# Patient Record
Sex: Male | Born: 2000 | Race: White | Hispanic: No | Smoking: Never smoker
Health system: Southern US, Community
[De-identification: ages and names within clinical notes are randomized; demographics above are authoritative.]

## PROBLEM LIST (undated history)

## (undated) HISTORY — PX: TONSILLECTOMY: SHX5217

## (undated) HISTORY — PX: ADENOIDECTOMY: SUR15

## (undated) HISTORY — PX: TONSILLECTOMY: SUR1361

## (undated) HISTORY — PX: TONSILLECTOMY AND ADENOIDECTOMY: SUR1326

---
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
## 2018-01-04 DIAGNOSIS — L700 Acne vulgaris: Secondary | ICD-10-CM | POA: Diagnosis not present

## 2018-01-04 DIAGNOSIS — Z79899 Other long term (current) drug therapy: Secondary | ICD-10-CM | POA: Diagnosis not present

## 2018-01-04 DIAGNOSIS — K130 Diseases of lips: Secondary | ICD-10-CM | POA: Diagnosis not present

## 2018-02-07 DIAGNOSIS — L700 Acne vulgaris: Secondary | ICD-10-CM | POA: Diagnosis not present

## 2018-02-07 DIAGNOSIS — Z79899 Other long term (current) drug therapy: Secondary | ICD-10-CM | POA: Diagnosis not present

## 2018-03-10 DIAGNOSIS — L700 Acne vulgaris: Secondary | ICD-10-CM | POA: Diagnosis not present

## 2018-03-10 DIAGNOSIS — Z79899 Other long term (current) drug therapy: Secondary | ICD-10-CM | POA: Diagnosis not present

## 2018-03-23 DIAGNOSIS — M5382 Other specified dorsopathies, cervical region: Secondary | ICD-10-CM | POA: Diagnosis not present

## 2018-03-23 DIAGNOSIS — M9901 Segmental and somatic dysfunction of cervical region: Secondary | ICD-10-CM | POA: Diagnosis not present

## 2018-03-23 DIAGNOSIS — M9902 Segmental and somatic dysfunction of thoracic region: Secondary | ICD-10-CM | POA: Diagnosis not present

## 2018-04-04 ENCOUNTER — Encounter: Payer: Self-pay | Admitting: Podiatry

## 2018-04-04 ENCOUNTER — Ambulatory Visit (INDEPENDENT_AMBULATORY_CARE_PROVIDER_SITE_OTHER): Payer: 59 | Admitting: Podiatry

## 2018-04-04 VITALS — BP 119/64 | HR 57

## 2018-04-04 DIAGNOSIS — L600 Ingrowing nail: Secondary | ICD-10-CM | POA: Diagnosis not present

## 2018-04-04 MED ORDER — DOXYCYCLINE HYCLATE 100 MG PO TABS: 100.0000 mg | ORAL_TABLET | Freq: Two times a day (BID) | ORAL | 0 refills | Status: DC

## 2018-04-04 MED ORDER — GENTAMICIN SULFATE 0.1 % EX CREA: 1.0000 "application " | TOPICAL_CREAM | Freq: Two times a day (BID) | CUTANEOUS | 1 refills | Status: DC

## 2018-04-04 NOTE — Patient Instructions (Signed)

## 2018-04-06 NOTE — Progress Notes (Signed)
   Subjective: Patient presents today for evaluation of pain to the lateral border of the right hallux that began 1-2 weeks ago. He reports associated bloody and purulent drainage. Patient is concerned for possible ingrown nail as he has gotten them in the past, just not as severe. Applying pressure to the toe increases the pain. He has been soaking the toe in peroxide for treatment. Patient presents today for further treatment and evaluation.  History reviewed. No pertinent past medical history.  Objective:  General: Well developed, nourished, in no acute distress, alert and oriented x3   Dermatology: Skin is warm, dry and supple bilateral. Lateral border of the right hallux appears to be erythematous with evidence of an ingrowing nail. Pain on palpation noted to the border of the nail fold. The remaining nails appear unremarkable at this time. There are no open sores, lesions.  Vascular: Dorsalis Pedis artery and Posterior Tibial artery pedal pulses palpable. No lower extremity edema noted.   Neruologic: Grossly intact via light touch bilateral.  Musculoskeletal: Muscular strength within normal limits in all groups bilateral. Normal range of motion noted to all pedal and ankle joints.   Assesement: #1 Paronychia with ingrowing nail lateral border right hallux  #2 Pain in toe #3 Incurvated nail  Plan of Care:  1. Patient evaluated.  2. Discussed treatment alternatives and plan of care. Explained nail avulsion procedure and post procedure course to patient. 3. Patient opted for permanent partial nail avulsion of the lateral border right hallux.  4. Prior to procedure, local anesthesia infiltration utilized using 3 ml of a 50:50 mixture of 2% plain lidocaine and 0.5% plain marcaine in a normal hallux block fashion and a betadine prep performed.  5. Partial permanent nail avulsion with chemical matrixectomy performed using [id number] applications of phenol followed by alcohol flush.  6. Light  dressing applied. 7. Prescription for Doxycycline provided to patient.  8. Prescription for Gentamicin provided to patient.  9. Return to clinic in 2 weeks.   Edrick Kins, DPM [facility]  Dr. Edrick Kins, [address]                                        [place], [place] [id number]                Office [phone]  Fax [phone]

## 2018-04-11 DIAGNOSIS — Z79899 Other long term (current) drug therapy: Secondary | ICD-10-CM | POA: Diagnosis not present

## 2018-04-11 DIAGNOSIS — L700 Acne vulgaris: Secondary | ICD-10-CM | POA: Diagnosis not present

## 2018-04-20 ENCOUNTER — Ambulatory Visit: Payer: 59 | Admitting: Podiatry

## 2018-05-12 DIAGNOSIS — L700 Acne vulgaris: Secondary | ICD-10-CM | POA: Diagnosis not present

## 2018-06-02 ENCOUNTER — Ambulatory Visit (INDEPENDENT_AMBULATORY_CARE_PROVIDER_SITE_OTHER): Payer: 59 | Admitting: Family Medicine

## 2018-06-02 ENCOUNTER — Encounter: Payer: Self-pay | Admitting: Family Medicine

## 2018-06-02 VITALS — BP 110/62 | HR 59 | Temp 98.2°F | Ht 71.0 in | Wt 153.0 lb

## 2018-06-02 DIAGNOSIS — F430 Acute stress reaction: Secondary | ICD-10-CM

## 2018-06-02 DIAGNOSIS — M26621 Arthralgia of right temporomandibular joint: Secondary | ICD-10-CM

## 2018-06-02 DIAGNOSIS — G44219 Episodic tension-type headache, not intractable: Secondary | ICD-10-CM | POA: Diagnosis not present

## 2018-06-02 DIAGNOSIS — M62838 Other muscle spasm: Secondary | ICD-10-CM | POA: Diagnosis not present

## 2018-06-02 MED ORDER — CYCLOBENZAPRINE HCL 10 MG PO TABS: 10.0000 mg | ORAL_TABLET | Freq: Three times a day (TID) | ORAL | 0 refills | Status: DC | PRN

## 2018-06-02 MED ORDER — ALBUTEROL SULFATE HFA 108 (90 BASE) MCG/ACT IN AERS: INHALATION_SPRAY | RESPIRATORY_TRACT | 2 refills | Status: AC

## 2018-06-02 NOTE — Progress Notes (Signed)
Pt here for an acute care OV today  Impression and Recommendations:    1. Episodic tension-type headache, not intractable   2. Muscle spasms -neck, back, shoulders, right facial/ masseter muscle   3. Reaction, situational, acute, to stress   4. TMJ tenderness, right   5. Masseter muscle spasm      1. Muscle Spasm & Associated Headaches - Discussed that patient's muscle spasm is likely caused by his increased weight-lifting and exercise habits.  - As you continue to exercise, engage in stress management, stretching, and relaxation.  - Reviewed the importance of warming up before exercise.  Lift lighter weights, with higher reps.  Continue to engage in cardio.  As much time as you spend lifting, spend stretching, relaxing, and elongating muscles afterward.  - Encouraged patient to engage in yoga.  - Discussed ice cup massage with patient today, as an avenue for relief.  - Flexeril prescribed for use at bedtime to assist with muscle relaxation.  - Extensive education provided, and all questions were answered.  2. Lifestyle & Preventative Health Maintenance - Encouraged patient to engage in prudent habits as he continues to build his muscle strength.  - Advised patient to continue working toward mindful exercise habits to improve overall mental, physical, and emotional health.    - Reviewed the "spokes of the wheel" of mood and health management.  Stressed the importance of ongoing prudent habits, including regular exercise, appropriate sleep hygiene, healthful dietary habits, and prayer/meditation to relax.  - Encouraged patient to continue engaging in daily physical activity, especially a formal exercise routine.  Recommended that the patient eventually strive for at least 150 minutes of moderate cardiovascular activity per week according to guidelines established by the [facility].   - Healthy dietary habits encouraged, including low-carb, and high amounts of lean protein in diet.    - Patient should also consume adequate amounts of water.   Meds ordered this encounter  Medications  . cyclobenzaprine (FLEXERIL) 10 MG tablet    Sig: Take 1 tablet (10 mg total) by mouth 3 (three) times daily as needed for muscle spasms.    Dispense:  30 tablet    Refill:  0    Medications Discontinued During This Encounter  Medication Reason  . Adapalene 0.3 % gel Completed Course  . doxycycline (VIBRA-TABS) 100 MG tablet Completed Course  . gentamicin cream (GARAMYCIN) 0.1 % Completed Course  . ondansetron (ZOFRAN ODT) 8 MG disintegrating tablet Completed Course      Education and routine counseling performed. Handouts provided  Gross side effects, risk and benefits, and alternatives of medications and treatment plan in general discussed with patient.  Patient is aware that all medications have potential side effects and we are unable to predict every side effect or drug-drug interaction that may occur.   Patient will call with any questions prior to using medication if they have concerns.    Expresses verbal understanding and consents to current therapy and treatment regimen.  No barriers to understanding were identified.  Red flag symptoms and signs discussed in detail.  Patient expressed understanding regarding what to do in case of emergency\urgent symptoms   Please see AVS handed out to patient at the end of our visit for further patient instructions/ counseling done pertaining to today's office visit.   Return for Follow-up for yearly physical when due in near future.     Note:  This document was prepared occasionally using Dragon voice recognition software and may include unintentional  dictation errors in addition to a scribe.  This document serves as a record of services personally performed by Mellody Dance, DO. It was created on her behalf by Toni Amend, a trained medical scribe. The creation of this record is based on the scribe's personal observations  and the provider's statements to them.   I have reviewed the above medical documentation for accuracy and completeness and I concur.  Mellody Dance, DO 06/05/2018 9:28 PM       ----------------------------------------------------------------------------------------------------------------------------    Subjective:    CC:  Chief Complaint  Patient presents with  . Headache    HPI: Marc Savage is a 18 y.o. male who presents to [place] [facility] today for issues as discussed below.  Has been having headaches for the past couple of weeks, on the right side.  States that it "feels like a pressure" and is also affecting his right eye.  Feels like the headache has been on-and-off itself, but has been reoccurring for two weeks.  Has a knot on his right shoulder.  States he isn't sure if that was affecting his head pain.  He has been laying off on exercising and sports since his headaches started.  However, he has been exercising more often in general lately.  He lifts 4 times per week, and engages in 30 minutes of cardio each session.  Denies visual changes with his headache.  States he's seen spots twice in his vision, "but nothing I would say that was too serious."  He has used Advil to treat his symptoms.  Has also tried a cold towel.  Confirms that Advil and the cold towel help to alleviate his headaches "a little bit."  Has been under stress at school and life.  Says it hasn't been "anything extreme, but it has been more than usual."   No problems updated.   Wt Readings from Last 3 Encounters:  06/02/18 153 lb (69.4 kg) (46 %, Z= -0.11)*  10/27/17 150 lb (68 kg) (44 %, Z= -0.16)*  07/08/17 145 lb (65.8 kg) (37 %, Z= -0.33)*   * Growth percentiles are based on CDC (Boys, 2-20 Years) data.   BP Readings from Last 3 Encounters:  06/02/18 140/75  04/04/18 119/64  10/27/17 128/69   BMI Readings from Last 3 Encounters:  06/02/18 21.34 kg/m (27  %, Z= -0.60)*  10/27/17 21.52 kg/m (34 %, Z= -0.42)*  07/08/17 20.81 kg/m (26 %, Z= -0.65)*   * Growth percentiles are based on CDC (Boys, 2-20 Years) data.     Patient Care Team    Relationship Specialty Notifications Start End  Mellody Dance, DO PCP - General Family Medicine  12/04/15      Patient Active Problem List   Diagnosis Date Noted  . Acne 12/19/2015  . h/o Exercise-induced asthma 12/18/2015  . h/o possible ocular Migraine headaches 12/18/2015  . Right hand pain 06/12/2014  . Left knee pain 01/05/2014      History reviewed. No pertinent past medical history.   Past Surgical History:  Procedure Laterality Date  . ADENOIDECTOMY    . TONSILLECTOMY    . TONSILLECTOMY AND ADENOIDECTOMY       Family History  Problem Relation Age of Onset  . Hypertension Maternal Grandmother      Social History   Socioeconomic History  . Marital status: Single    Spouse name: Not on file  . Number of children: Not on file  . Years of education: Not on  file  . Highest education level: Not on file  Occupational History  . Not on file  Social Needs  . Financial resource strain: Not on file  . Food insecurity:    Worry: Not on file    Inability: Not on file  . Transportation needs:    Medical: Not on file    Non-medical: Not on file  Tobacco Use  . Smoking status: Never Smoker  . Smokeless tobacco: Never Used  Substance and Sexual Activity  . Alcohol use: No  . Drug use: No  . Sexual activity: Never  Lifestyle  . Physical activity:    Days per week: Not on file    Minutes per session: Not on file  . Stress: Not on file  Relationships  . Social connections:    Talks on phone: Not on file    Gets together: Not on file    Attends religious service: Not on file    Active member of club or organization: Not on file    Attends meetings of clubs or organizations: Not on file    Relationship status: Not on file  . Intimate partner violence:    Fear of current  or ex partner: Not on file    Emotionally abused: Not on file    Physically abused: Not on file    Forced sexual activity: Not on file  Other Topics Concern  . Not on file  Social History Narrative  . Not on file     Current Meds  Medication Sig  . albuterol (PROVENTIL HFA;VENTOLIN HFA) 108 (90 Base) MCG/ACT inhaler 2 puffs prior to exercise as needed for exercise-induced shortness of breath/asthma    Allergies:  Allergies  Allergen Reactions  . Sulfa Antibiotics Rash and Other (See Comments)    abd cramping     Review of Systems: General:   Denies fever, chills, unexplained weight loss.  Optho/Auditory:   Denies visual changes, blurred vision/LOV Respiratory:   Denies wheeze, DOE more than baseline levels.   Cardiovascular:   Denies chest pain, palpitations, new onset peripheral edema  Gastrointestinal:   Denies nausea, vomiting, diarrhea, abd pain.  Genitourinary: Denies dysuria, freq/ urgency, flank pain or discharge from genitals.  Endocrine:     Denies hot or cold intolerance, polyuria, polydipsia. Musculoskeletal:   Denies unexplained myalgias, joint swelling, unexplained arthralgias, gait problems.  Skin:  Denies new onset rash, suspicious lesions Neurological:     Denies dizziness, unexplained weakness, numbness  Psychiatric/Behavioral:   Denies mood changes, suicidal or homicidal ideations, hallucinations    Objective:   Blood pressure 140/75, pulse (!) 59, temperature 98.2 F (36.8 C), height 5\' 11"  (1.803 m), weight 153 lb (69.4 kg), SpO2 99 %. Body mass index is 21.34 kg/m. General:  Well Developed, well nourished, appropriate for stated age.  Neuro:  Alert and oriented,  extra-ocular muscles intact  HEENT:  Normocephalic, atraumatic, neck supple Skin:  no gross rash, warm, pink. Cardiac:  RRR, S1 S2 Respiratory:  ECTA B/L and A/P, Not using accessory muscles, speaking in full sentences- unlabored. Vascular:  Ext warm, no cyanosis apprec.; cap RF less 2  sec. Psych:  No HI/SI, judgement and insight good, Euthymic mood. Full Affect. Musculoskeletal:  Significant paravertebral muscle spasms in the L1-L4 distribution on the right, in the T4-T7 distribution on the left, and then also in the right upper traps and paravertebral region from C6-T1 on the right.  Also has trigger points and muscle spasms in the right masseter muscle, with  bogginess and swelling of all muscle groups that are spasmed.  No bony tenderness.

## 2018-06-02 NOTE — Patient Instructions (Addendum)
Drink at least 80 ounces of water per day, and an extra 16.9 ounce bottle for every 30 minutes of exercise that you engage in.

## 2018-07-11 ENCOUNTER — Other Ambulatory Visit (INDEPENDENT_AMBULATORY_CARE_PROVIDER_SITE_OTHER): Payer: 59

## 2018-07-11 DIAGNOSIS — Z0000 Encounter for general adult medical examination without abnormal findings: Secondary | ICD-10-CM

## 2018-07-12 LAB — LIPID PANEL
Chol/HDL Ratio: 2.6 ratio (ref 0.0–5.0)
Cholesterol, Total: 163 mg/dL (ref 100–199)
HDL: 63 mg/dL (ref >39)
LDL CALC: 92 mg/dL (ref 0–99)
Triglycerides: 40 mg/dL (ref 0–149)
VLDL CHOLESTEROL CAL: 8 mg/dL (ref 5–40)

## 2018-07-12 LAB — COMPREHENSIVE METABOLIC PANEL
ALBUMIN: 4.8 g/dL (ref 4.1–5.2)
ALT: 18 IU/L (ref 0–44)
AST: 20 IU/L (ref 0–40)
Albumin/Globulin Ratio: 2.8 — ABNORMAL HIGH (ref 1.2–2.2)
Alkaline Phosphatase: 87 IU/L (ref 39–117)
BUN/Creatinine Ratio: 12 (ref 9–20)
BUN: 14 mg/dL (ref 6–20)
Bilirubin Total: 0.5 mg/dL (ref 0.0–1.2)
CO2: 22 mmol/L (ref 20–29)
CREATININE: 1.15 mg/dL (ref 0.76–1.27)
Calcium: 9.7 mg/dL (ref 8.7–10.2)
Chloride: 103 mmol/L (ref 96–106)
GFR, EST AFRICAN AMERICAN: 105 mL/min/{1.73_m2} (ref >59)
GFR, EST NON AFRICAN AMERICAN: 91 mL/min/{1.73_m2} (ref >59)
GLOBULIN, TOTAL: 1.7 g/dL (ref 1.5–4.5)
Glucose: 87 mg/dL (ref 65–99)
POTASSIUM: 5.1 mmol/L (ref 3.5–5.2)
SODIUM: 141 mmol/L (ref 134–144)
TOTAL PROTEIN: 6.5 g/dL (ref 6.0–8.5)

## 2018-07-12 LAB — CBC WITH DIFFERENTIAL/PLATELET
BASOS: 1 %
Basophils Absolute: 0 10*3/uL (ref 0.0–0.2)
EOS (ABSOLUTE): 0.1 10*3/uL (ref 0.0–0.4)
EOS: 2 %
HEMATOCRIT: 46 % (ref 37.5–51.0)
HEMOGLOBIN: 15.5 g/dL (ref 13.0–17.7)
IMMATURE GRANS (ABS): 0 10*3/uL (ref 0.0–0.1)
IMMATURE GRANULOCYTES: 0 %
LYMPHS: 48 %
Lymphocytes Absolute: 2.8 10*3/uL (ref 0.7–3.1)
MCH: 28.7 pg (ref 26.6–33.0)
MCHC: 33.7 g/dL (ref 31.5–35.7)
MCV: 85 fL (ref 79–97)
MONOCYTES: 9 %
MONOS ABS: 0.5 10*3/uL (ref 0.1–0.9)
Neutrophils Absolute: 2.3 10*3/uL (ref 1.4–7.0)
Neutrophils: 40 %
Platelets: 286 10*3/uL (ref 150–450)
RBC: 5.4 x10E6/uL (ref 4.14–5.80)
RDW: 11.9 % (ref 11.6–15.4)
WBC: 5.8 10*3/uL (ref 3.4–10.8)

## 2018-07-12 LAB — T4, FREE: Free T4: 1.74 ng/dL (ref 0.82–1.77)

## 2018-07-12 LAB — HEMOGLOBIN A1C
ESTIMATED AVERAGE GLUCOSE: 94 mg/dL
Hgb A1c MFr Bld: 4.9 % (ref 4.8–5.6)

## 2018-07-12 LAB — VITAMIN D 25 HYDROXY (VIT D DEFICIENCY, FRACTURES): Vit D, 25-Hydroxy: 30 ng/mL (ref 30.0–100.0)

## 2018-07-12 LAB — TSH: TSH: 1.73 u[IU]/mL (ref 0.450–4.500)

## 2018-07-18 ENCOUNTER — Encounter: Payer: Self-pay | Admitting: Family Medicine

## 2018-07-18 ENCOUNTER — Ambulatory Visit (INDEPENDENT_AMBULATORY_CARE_PROVIDER_SITE_OTHER): Payer: 59 | Admitting: Family Medicine

## 2018-07-18 VITALS — BP 146/84 | HR 64 | Temp 98.1°F | Ht 71.0 in | Wt 154.4 lb

## 2018-07-18 DIAGNOSIS — Z0000 Encounter for general adult medical examination without abnormal findings: Secondary | ICD-10-CM

## 2018-07-18 DIAGNOSIS — Z719 Counseling, unspecified: Secondary | ICD-10-CM

## 2018-07-18 DIAGNOSIS — Z113 Encounter for screening for infections with a predominantly sexual mode of transmission: Secondary | ICD-10-CM

## 2018-07-18 NOTE — Progress Notes (Signed)
Male physical  Impression and Recommendations:    1. Encounter for wellness examination   2. Health education/counseling   3. Screening for STD (sexually transmitted disease)   4. Laboratory tests ordered as part of a complete physical exam (CPE)      1) Anticipatory Guidance: Discussed importance of wearing a seatbelt while driving, not texting while driving;   sunscreen when outside along with skin surveillance; eating a balanced and modest diet; physical activity at least 25 minutes per day or 150 min/ week moderate to intense activity.  - Begin 04-Dec-1998 IU's Vitamin D3 daily.  - Sexual health counseling and education performed today.  All questions were answered. - Encouraged patient to use protection with every sexual encounter, and obtain screening PRN.  - Prudent sexual health habits and prudent lifestyle habits discussed at length with patient today.   - Educated patient regarding headaches and migraines.  - Prudent self-testicular screening habits reviewed and discussed during exam today.  2) Immunizations / Screenings / Labs:  All immunizations are up-to-date per recommendations or will be updated today. Patient is due for dental and vision screens which pt will schedule independently. Will obtain CBC, CMP, HgA1c, Lipid panel, TSH and vit D when fasting, if not already done recently.   - Encouraged patient to visit eye doctor regularly.  - Need for STD screen.  3) Weight:   BMI meaning discussed with patient.  Discussed goal of increasing physical activity, which would improve overall feelings of well being and help maintain objective health data.  Improve nutrient density of diet through increasing intake of fruits and vegetables and decreasing saturated fats, white flour products and refined sugars.   - Educated patient regarding prudent exercise habits.    No orders of the defined types were placed in this encounter.   No orders of the defined types were  placed in this encounter.    Gross side effects, risk and benefits, and alternatives of medications discussed with patient.  Patient is aware that all medications have potential side effects and we are unable to predict every side effect or drug-drug interaction that may occur.  Expresses verbal understanding and consents to current therapy plan and treatment regimen.  Please see AVS handed out to patient at the end of our visit for further patient instructions/ counseling done pertaining to today's office visit.  Follow-up preventative CPE in 1 year. Follow-up office visit pending lab work.  F/up sooner for chronic care management and/or prn  This document serves as a record of services personally performed by Mellody Dance, DO. It was created on her behalf by Toni Amend, a trained medical scribe. The creation of this record is based on the scribe's personal observations and the provider's statements to them.   I have reviewed the above medical documentation for accuracy and completeness and I concur.  Mellody Dance, DO 07/19/2018 7:51 PM        Subjective:    CC: CPE  HPI: Marc Savage is a 18 y.o. male who presents to [place] at [facility] today for a yearly health maintenance exam.     Health Maintenance Summary Reviewed and updated, unless pt declines services.  Tobacco History Reviewed:  Never smoker. Alcohol:  No concerns, no excessive use.  Notes he drinks socially but no drinking and driving.  He and his friends use [employer] if they go out, and if not, they stay in one building and do not travel. Exercise  Habits:  Notes doing 30 minutes of cardio, three times per week.  He is lifting weights for an hour, three times per week. STD concerns:  None.  He has been sexually active and states he uses condoms every time.  Reports that he has had two lifetime sexual partners.  States that, as far as he knows, his past partners have not had a lot of  partners.  He has used protection every time he has sex.  He has only been in closed relationships, as far as he knows; monogamous. Drug Use:  None.  Denies drug use. Birth control method:  States he uses condoms every time. Testicular/penile concerns:  Denies concerns.  Denies risky activities, such as snowboarding, driving fast, etc.  Visual Health Denies concerns.  Headaches States that if he has a migraine, which happens pretty frequently, he feels pressure on his right eye.  Notes this has been going on for a long time.  When he starts having a headache, he medicates depending on how bad it is.  He continues exercising daily, stretching, and drinking plenty of water.  Dental Health Patient thinks he goes to the dentist either every year or every six months.  Dermatological Health Denies new spots or concerning areas on his skin.  Emotional Health Notes that he's tried meditation, "but I didn't really get anything out of it, I guess."    Immunization History  Administered Date(s) Administered  . DTaP 08/08/1998, 10/07/1998, 12/05/1998, 01/22/2000, 02/20/2003  . HPV Bivalent 05/20/2011, 07/04/2012, 07/17/2013  . Hepatitis A 10/05/2007, 04/09/2009  . Hepatitis B 07/09/1998, 08/08/1998, 03/05/1999  . HiB (PRP-OMP) 08/08/1998, 10/07/1998, 12/05/1998  . IPV 08/08/1998, 10/07/1998, 06/11/1999, 02/20/2003  . MMR 06/11/1999, 02/20/2003  . Meningococcal Polysaccharide 05/20/2011, 10/01/2014  . Pneumococcal Conjugate-13 10/07/1998, 12/05/1998, 03/05/1999, 06/11/1999  . Tdap 04/09/2009  . Varicella 10/05/2007, 04/09/2009    Health Maintenance  Topic Date Due  . INFLUENZA VACCINE  02/09/2019 (Originally 12/09/2017)  . HIV Screening  06/03/2019 (Originally 06/04/2013)  . TETANUS/TDAP  04/10/2019      Wt Readings from Last 3 Encounters:  07/18/18 154 lb 6.4 oz (70 kg)  06/02/18 153 lb (69.4 kg) (46 %, Z= -0.11)*  10/27/17 150 lb (68 kg) (44 %, Z= -0.16)*   * Growth  percentiles are based on CDC (Boys, 2-20 Years) data.   BP Readings from Last 3 Encounters:  07/18/18 (!) 146/84  06/02/18 110/62  04/04/18 119/64   Pulse Readings from Last 3 Encounters:  07/18/18 64  06/02/18 (!) 59  04/04/18 (!) 57    Patient Active Problem List   Diagnosis Date Noted  . Acne 12/19/2015  . h/o Exercise-induced asthma 12/18/2015  . h/o possible ocular Migraine headaches 12/18/2015  . Right hand pain 06/12/2014  . Left knee pain 01/05/2014    History reviewed. No pertinent past medical history.  Past Surgical History:  Procedure Laterality Date  . ADENOIDECTOMY    . TONSILLECTOMY    . TONSILLECTOMY AND ADENOIDECTOMY      Family History  Problem Relation Age of Onset  . Hypertension Maternal Grandmother     Social History   Substance and Sexual Activity  Drug Use No  ,  Social History   Substance and Sexual Activity  Alcohol Use No  ,  Social History   Tobacco Use  Smoking Status Never Smoker  Smokeless Tobacco Never Used  ,  Social History   Substance and Sexual Activity  Sexual Activity Never    Patient's Medications  New Prescriptions   No medications on file  Previous Medications   ALBUTEROL (PROVENTIL HFA;VENTOLIN HFA) 108 (90 BASE) MCG/ACT INHALER    2 puffs prior to exercise as needed for exercise-induced shortness of breath/asthma   CYCLOBENZAPRINE (FLEXERIL) 10 MG TABLET    Take 1 tablet (10 mg total) by mouth 3 (three) times daily as needed for muscle spasms.  Modified Medications   No medications on file  Discontinued Medications   No medications on file    Sulfa antibiotics  Review of Systems: General:   Denies fever, chills, unexplained weight loss.  Optho/Auditory:   Denies visual changes, blurred vision/LOV Respiratory:   Denies SOB, DOE more than baseline levels.  Cardiovascular:   Denies chest pain, palpitations, new onset peripheral edema  Gastrointestinal:   Denies nausea, vomiting, diarrhea.    Genitourinary: Denies dysuria, freq/ urgency, flank pain or discharge from genitals.  Endocrine:     Denies hot or cold intolerance, polyuria, polydipsia. Musculoskeletal:   Denies unexplained myalgias, joint swelling, unexplained arthralgias, gait problems.  Skin:  Denies rash, suspicious lesions Neurological:     Denies dizziness, unexplained weakness, numbness  Psychiatric/Behavioral:   Denies mood changes, suicidal or homicidal ideations, hallucinations    Objective:     Blood pressure (!) 146/84, pulse 64, temperature 98.1 F (36.7 C), height 5' 11" (1.803 m), weight 154 lb 6.4 oz (70 kg), SpO2 99 %. Body mass index is 21.53 kg/m. General Appearance:    Alert, cooperative, no distress, appears stated age  Head:    Normocephalic, without obvious abnormality, atraumatic  Eyes:    PERRL, conjunctiva/corneas clear, EOM's intact, fundi    benign, both eyes  Ears:    Normal TM's and external ear canals, both ears  Nose:   Nares normal, septum midline, mucosa normal, no drainage    or sinus tenderness  Throat:   Lips w/o lesion, mucosa moist, and tongue normal; teeth and   gums normal  Neck:   Supple, symmetrical, trachea midline, no adenopathy;    thyroid:  no enlargement/tenderness/nodules; no carotid   bruit or JVD  Back:     Right paravertebral spinal muscles are enlarged from about T8 down to L2, L3, on the left.  Patient with several muscle spasms in indicated area.  Symmetric, no curvature, ROM normal, no CVA tenderness  Lungs:     Clear to auscultation bilaterally, respirations unlabored, no       Wh/ R/ R  Chest Wall:    No tenderness or gross deformity; normal excursion   Heart:    Regular rate and rhythm, S1 and S2 normal, no murmur, rub   or gallop  Abdomen:     Soft, non-tender, bowel sounds active all four quadrants, NO   G/R/R, no masses, no organomegaly  Genitalia:    Ext genitalia: without lesion, no penile rash or discharge, no hernias appreciated   Rectal:     Deferred.  Extremities:   Extremities normal, atraumatic, no cyanosis or gross edema  Pulses:   2+ and symmetric all extremities  Skin:   Warm, dry, Skin color, texture, turgor normal, no obvious rashes or lesions  M-Sk:   Ambulates * 4 w/o difficulty, no gross deformities, tone WNL  Neurologic:   CNII-XII intact, normal strength, sensation and reflexes    Throughout Psych:  No HI/SI, judgement and insight good, Euthymic mood. Full Affect.

## 2018-07-18 NOTE — Patient Instructions (Signed)

## 2019-01-18 ENCOUNTER — Ambulatory Visit: Payer: 59 | Admitting: Family Medicine

## 2019-02-14 ENCOUNTER — Ambulatory Visit (INDEPENDENT_AMBULATORY_CARE_PROVIDER_SITE_OTHER): Payer: 59 | Admitting: Family Medicine

## 2019-02-14 ENCOUNTER — Other Ambulatory Visit: Payer: Self-pay

## 2019-02-14 ENCOUNTER — Encounter: Payer: Self-pay | Admitting: Family Medicine

## 2019-02-14 VITALS — BP 127/71 | HR 56 | Ht 71.0 in | Wt 150.0 lb

## 2019-02-14 DIAGNOSIS — K625 Hemorrhage of anus and rectum: Secondary | ICD-10-CM | POA: Diagnosis not present

## 2019-02-14 DIAGNOSIS — R590 Localized enlarged lymph nodes: Secondary | ICD-10-CM | POA: Insufficient documentation

## 2019-02-14 DIAGNOSIS — K648 Other hemorrhoids: Secondary | ICD-10-CM | POA: Diagnosis not present

## 2019-02-14 MED ORDER — HYDROCORT-PRAMOXINE (PERIANAL) 1-1 % EX FOAM: 1.0000 | Freq: Two times a day (BID) | CUTANEOUS | 1 refills | Status: DC

## 2019-02-14 NOTE — Patient Instructions (Addendum)

## 2019-02-14 NOTE — Progress Notes (Signed)
Impression and Recommendations:    1. Anal bleeding   2. Internal hemorrhoid, bleeding   3. Cervical lymphadenopathy     Left Anterior Cervical Lymphadenopathy - L ant cervical/periauricular region - Extensively discussed patient's symptoms in office today. - Reviewed that lymph nodes may be more visible because the patient is thin. - Since lymph nodes are not tender to palpation, discussed it is not likely to be an abscess.  - Advised watching the area for changes.  - Told patient to palpate the area tonight to get an idea of size, and then once every two weeks, continue to monitor for changes.  If patient feels that the lymph node is growing, we will send for ultrasound and further assessment.  - Discussed need to otherwise avoid touching and manipulating the area.  - If the lymph node develops tenderness to palpation, patient knows to call in to clinic. - Otherwise, call in PRN with any changes, worsening symptoms, or other concerns.  - Will continue to monitor.   Anal Bleeding, Internal Hemorrhoid - Education provided to patient today. - Lengthy conversation was held and all questions were answered.  PROCEDURE - ANOSCOPY Indication: Anal Bleeding Consent:  Discussed benefits and risks of procedure and verbal consent obtained Procedure:   Patient was prepped and laid in the right lateral recumbent position for the procedure.  Visual inspection showed no external hemorrhoids, fissures, rash, or erythema/swelling.  Utilized an anoscope to assess and take note of the rectal canal.  Rather large internal hemorrhoid in the roughly 5-6 o'clock position that is tender to palpation with active bleeding, purplish /red buldging, not thrombosed. EBL: Less than 0.5 cc. Post-Procedural Instructions:    Patient tolerated procedure well.  Discussed routine rectal care and bowel movement hygiene.  If stools loose, add bulking agents such as fiber and/or psyllium, adequate hydration  stressed, and use Miralax PRN for constipation.  - Symptoms discussed with patient today. - Discussed red flag symptoms of thrombosed hemorrhoid, avoiding triggers such as straining and pushing to pass stool. - Patient understand need to go to emergency room or urgent care for relief if thrombosis occurs.  - Prescription cream provided today.  See med list. - Discussed application with patient.  Utilize cream twice daily.  - For pain relief, discussed use of sitz baths. - Advised use of stool softeners to keep stools loose and non-bulky. - Avoid foods that cause big bulky stools.  - Handout provided on internal hemorrhoids.  - Discussed activities that can increase pressure on the rectum, such as having sex, lifting weights, etc.  Advised patient to breathe through pressure-causing activities and avoid bearing down in the pelvic area.  - Will continue to monitor closely.   Meds ordered this encounter  Medications  . hydrocortisone-pramoxine (PROCTOFOAM-HC) rectal foam    Sig: Place 1 applicator rectally 2 (two) times daily.    Dispense:  10 g    Refill:  1    Gross side effects, risk and benefits, and alternatives of medications and treatment plan in general discussed with patient.  Patient is aware that all medications have potential side effects and we are unable to predict every side effect or drug-drug interaction that may occur.   Patient will call with any questions prior to using medication if they have concerns.    Expresses verbal understanding and consents to current therapy and treatment regimen.  No barriers to understanding were identified.  Red flag symptoms and signs discussed in detail.  Patient expressed understanding regarding what to do in case of emergency\urgent symptoms  Please see AVS handed out to patient at the end of our visit for further patient instructions/ counseling done pertaining to today's office visit.   Return if symptoms worsen or fail to  improve, for Physical Exam around 07/18/19; and as needed.     Note:  This note was prepared with assistance of Dragon voice recognition software. Occasional wrong-word or sound-a-like substitutions may have occurred due to the inherent limitations of voice recognition software.   This document serves as a record of services personally performed by Mellody Dance, DO. It was created on her behalf by Toni Amend, a trained medical scribe. The creation of this record is based on the scribe's personal observations and the provider's statements to them.   I have reviewed the above medical documentation for accuracy and completeness and I concur.  Mellody Dance, DO 02/14/2019 5:29 PM      --------------------------------------------------------------------------------------------------------------------------------------------------------------------------------------------------------------------------------------------    Subjective:     HPI: Marc Savage is a 18 y.o. male who presents to [facility] today for issues as discussed below.  He is back in school.  Notes "with COVID in school, he's taking classes online."  Says "they wanted to put as many classes online as possible."  - Swollen Lymph Node in Neck Notes "I've noticed it getting noticeably bigger on the left side."    Says he had a pain in the lymph node area a month or two ago, nothing terrible "but it was unusual."  Has noticed that the node has been swelling up since and "wanted to make sure that's okay."  Says "it just stays big," and the size does not change depending on time of day.  Denies sore throat.  Denies congestion or allergy-like symptoms.   Denies bad taste in mouth, denies dry mouth. Denies fever/chills.  Denies post-nasal drip at night.  - Blood with Bowel Movement Whenever he has to pass a bowel movement, notes "basically when I would wipe, there would be a little  bit of blood on whatever, and I figured you know maybe I just have a cut or something like that or a rough passage."  Notes symptoms have been going on for 1 -2 months with a short span where it was back to normal, but the bleeding came back yesterday and "there was blood noticeably with my poop in the toilet."  States he felt like it was pretty serious, especially since it was more blood.  Says the area is sore.  States "it honestly feels like I have a cut."  Denies straining or constipation lately.   Says his stool size and bulkiness "really just depends."  Says he has anal pain when he poops, but "other than that, no."    Denies itching in the area.  Denies inserting anything into his anus. Denies trauma that he knows of, falling, landing on his rear end, etc.  Has never had a hemorrhoid that he's aware of.  Confirms he is actively having sex.    Wt Readings from Last 3 Encounters:  02/14/19 150 lb (68 kg)  07/18/18 154 lb 6.4 oz (70 kg)  06/02/18 153 lb (69.4 kg) (46 %, Z= -0.11)*   * Growth percentiles are based on CDC (Boys, 2-20 Years) data.   BP Readings from Last 3 Encounters:  02/14/19 127/71  07/18/18 (!) 146/84  06/02/18 110/62   Pulse Readings from Last 3 Encounters:  02/14/19 (!) 56  07/18/18 64  06/02/18 (!) 59   BMI Readings from Last 3 Encounters:  02/14/19 20.92 kg/m  07/18/18 21.53 kg/m  06/02/18 21.34 kg/m (27 %, Z= -0.60)*   * Growth percentiles are based on CDC (Boys, 2-20 Years) data.     Patient Care Team    Relationship Specialty Notifications Start End  Mellody Dance, DO PCP - General Family Medicine  12/04/15      Patient Active Problem List   Diagnosis Date Noted  . Cervical lymphadenopathy 02/14/2019  . Internal hemorrhoid, bleeding 02/14/2019  . Anal bleeding 02/14/2019  . Acne 12/19/2015  . h/o Exercise-induced asthma 12/18/2015  . h/o possible ocular Migraine headaches 12/18/2015  . Right hand pain 06/12/2014  . Left knee  pain 01/05/2014    Past Medical history, Surgical history, Family history, Social history, Allergies and Medications have been entered into the medical record, reviewed and changed as needed.    Current Meds  Medication Sig  . albuterol (PROVENTIL HFA;VENTOLIN HFA) 108 (90 Base) MCG/ACT inhaler 2 puffs prior to exercise as needed for exercise-induced shortness of breath/asthma    Allergies:  Allergies  Allergen Reactions  . Sulfa Antibiotics Rash and Other (See Comments)    abd cramping     Review of Systems:  A fourteen system review of systems was performed and found to be positive as per HPI.   Objective:   Blood pressure 127/71, pulse (!) 56, height 5\' 11"  (1.803 m), weight 150 lb (68 kg), SpO2 99 %. Body mass index is 20.92 kg/m. General:  Well Developed, well nourished, appropriate for stated age.  Neuro:  Alert and oriented,  extra-ocular muscles intact  HEENT:  Patient with anterior cervical lymphadenopathy inferior to left mandible- in region of jugulodigastric node-  that is freely mobile and approximately 1 cm by 1.2 cm in size it is nontender.  Anterior cervical area also with a small lymph node palpable on the right submandibular region.  Both lymph nodes are non tender.  Negative posterior cervical lymphadenopathy.  No axillary lymphadenopathy; no supraclavicular lymphadenopathy. Both TM's clear with good light reflex; right slightly bulged more than left side, but minimal bilaterally.  No effusion, no erythema, and no EAC trauma or abnormality.  Normocephalic, atraumatic, neck supple, no carotid bruits appreciated  Skin:  no gross rash, warm, pink. Cardiac:  RRR, S1 S2 Respiratory:  ECTA B/L and A/P, Not using accessory muscles, speaking in full sentences- unlabored. Vascular:  Ext warm, no cyanosis apprec.; cap RF less 2 sec. Psych:  No HI/SI, judgement and insight good, Euthymic mood. Full Affect. Rectal:  DRE-  is tender to palpation only in the region of the  internal hemorrhoid.  Rather large internal hemorrhoid in the roughly 5-6 o'clock position that is tender to palpation with active bleeding, purplish /red buldging, not thrombosed

## 2019-04-19 IMAGING — DX DG CHEST 2V
2 series · 2 of 2 positions shown · non-contrast
Comparison: 06/16/2016

CLINICAL DATA: Chest pain

EXAM:
CHEST - 2 VIEW

[chest pa]
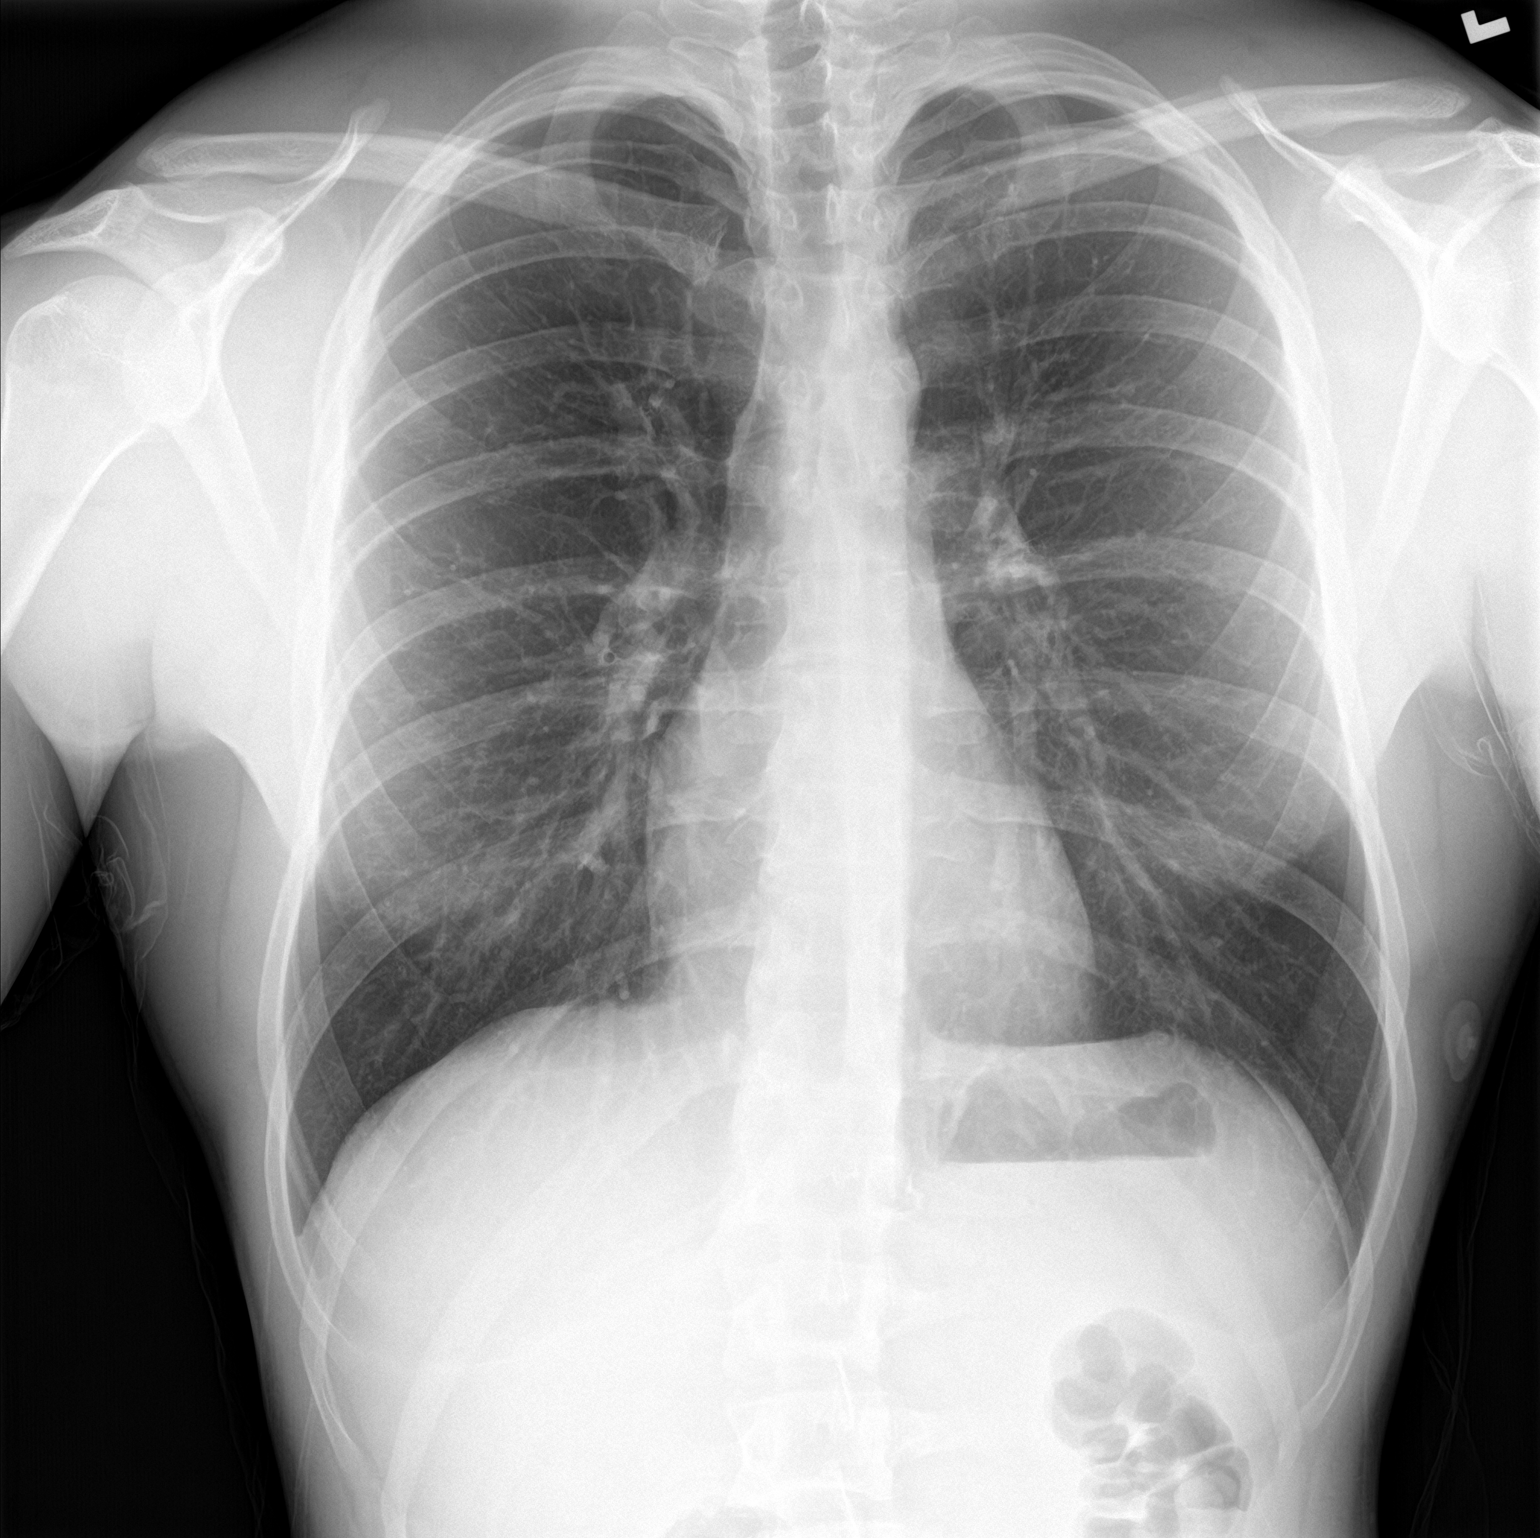

[chest lat]
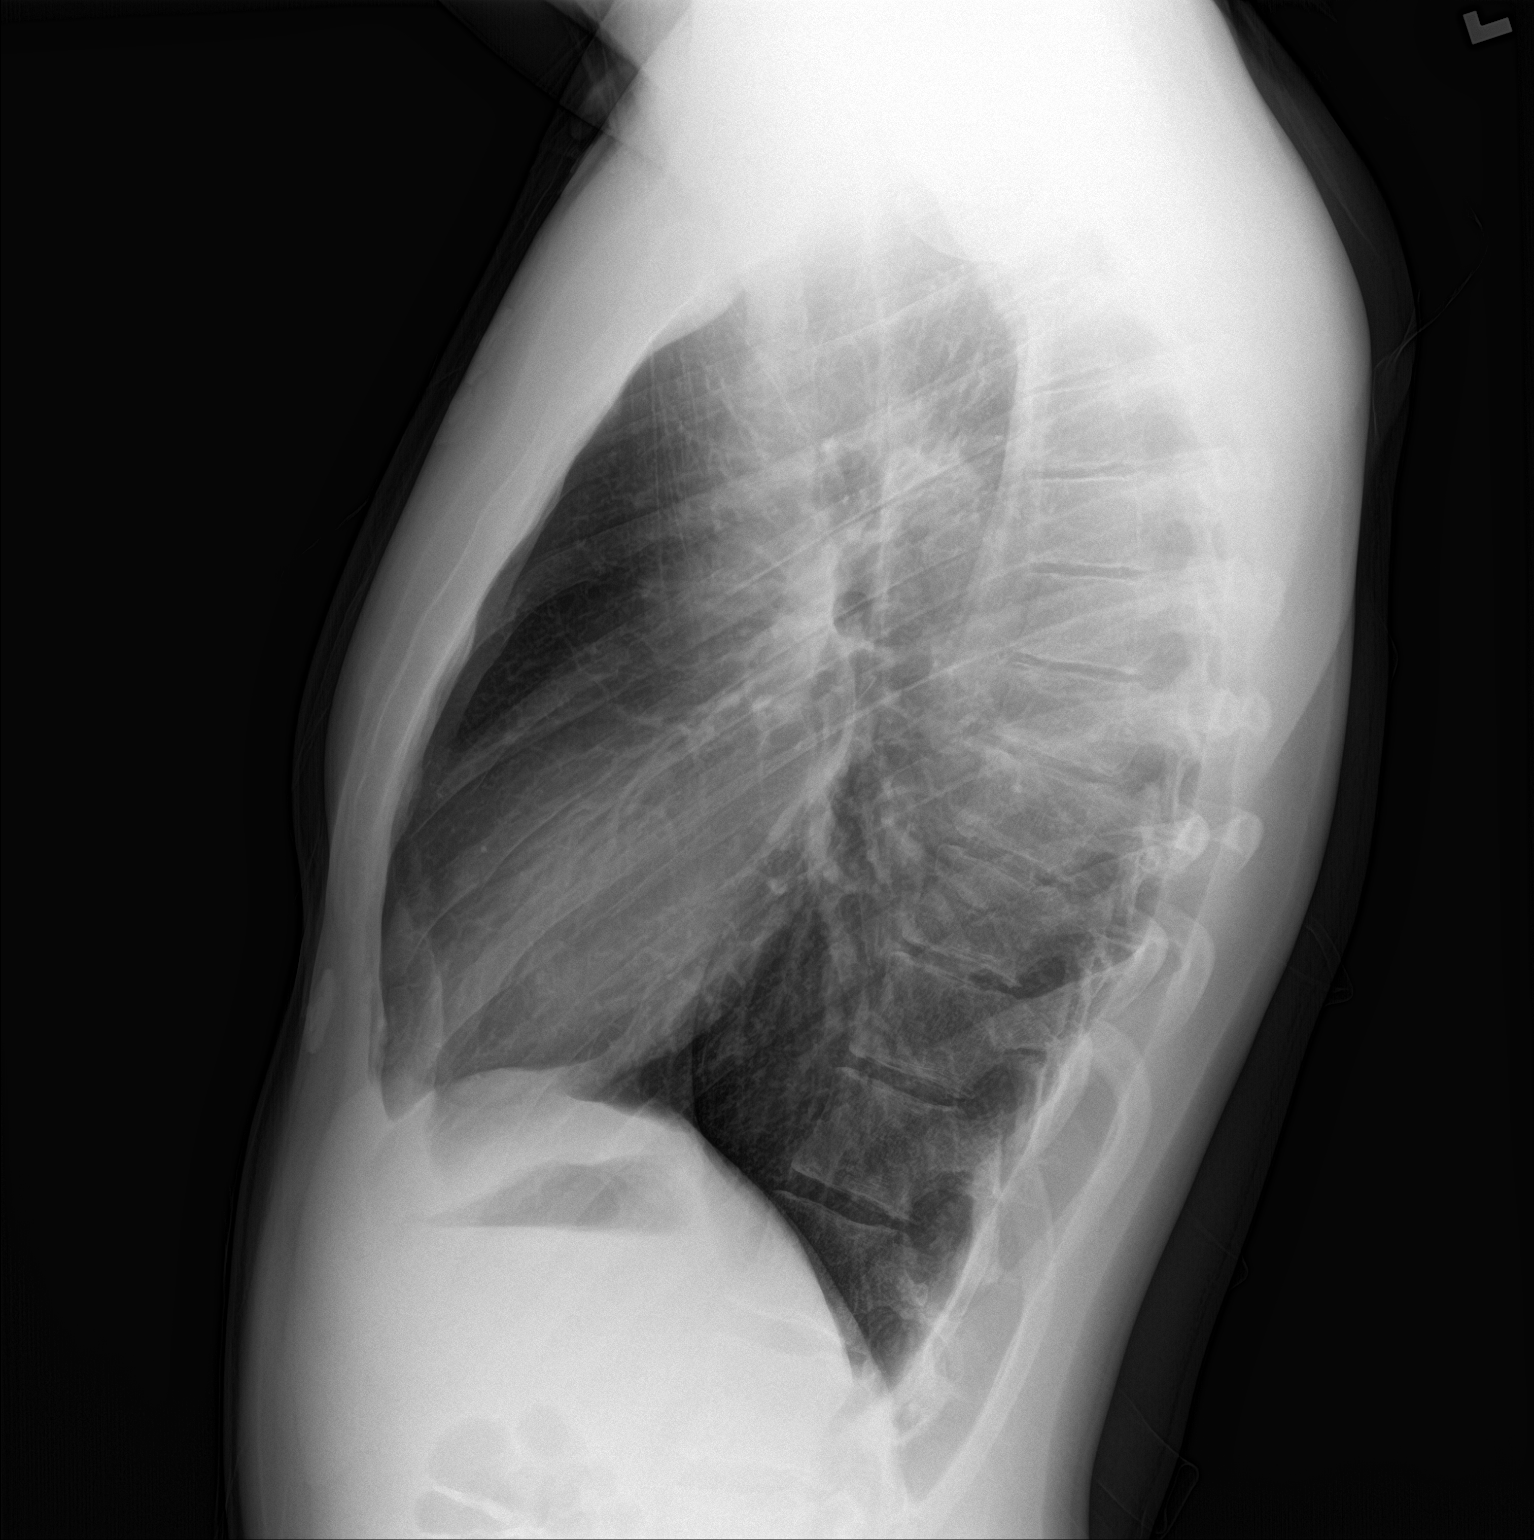

[2 of 2 positions shown; findings below may reference images not displayed]

FINDINGS: Normal heart size and mediastinal contours. No acute infiltrate or
edema. No effusion or pneumothorax. No acute osseous findings.

Artifact from EKG leads.
IMPRESSION: Negative chest.

## 2019-07-31 ENCOUNTER — Encounter: Payer: 59 | Admitting: Family Medicine

## 2022-07-19 NOTE — Progress Notes (Unsigned)
   CC: Patient here to establish care  Last PCP:   Specialists:  Originally from:   Brief History, Exam, Impression, and Recommendations:    There were no vitals taken for this visit.  No problem-specific Assessment & Plan notes found for this encounter.   No follow-ups on file. Needs physical   __________________________________________ Les Pou, FNP-C [facility] and [place]

## 2022-07-20 ENCOUNTER — Other Ambulatory Visit: Payer: Self-pay

## 2022-07-20 ENCOUNTER — Encounter (HOSPITAL_BASED_OUTPATIENT_CLINIC_OR_DEPARTMENT_OTHER): Payer: Self-pay | Admitting: Family Medicine

## 2022-07-20 ENCOUNTER — Ambulatory Visit (HOSPITAL_BASED_OUTPATIENT_CLINIC_OR_DEPARTMENT_OTHER): Payer: 59 | Admitting: Family Medicine

## 2022-07-20 VITALS — BP 139/79 | HR 71 | Ht 71.0 in | Wt 167.8 lb

## 2022-07-20 DIAGNOSIS — R002 Palpitations: Secondary | ICD-10-CM | POA: Diagnosis not present

## 2022-07-20 DIAGNOSIS — F419 Anxiety disorder, unspecified: Secondary | ICD-10-CM | POA: Diagnosis not present

## 2022-07-20 DIAGNOSIS — Z0000 Encounter for general adult medical examination without abnormal findings: Secondary | ICD-10-CM

## 2022-07-20 HISTORY — DX: Palpitations: R00.2

## 2022-07-20 HISTORY — DX: Anxiety disorder, unspecified: F41.9

## 2022-07-20 MED ORDER — FLUOXETINE HCL 10 MG PO CAPS: 10.0000 mg | ORAL_CAPSULE | Freq: Every day | ORAL | 0 refills | Status: DC

## 2022-07-20 NOTE — Assessment & Plan Note (Signed)
Patient reports feeling palpitations when he is "stressed out." Denies chest pain, shortness of breath, dizziness, and lightheadedness. EKG performed today to rule out dysrhythmias- normal sinus rhythm. Normal P waves, QRS complex, and normal T waves present.

## 2022-07-20 NOTE — Assessment & Plan Note (Signed)
Patient reports dealing with anxiety in his past and feels as if it is affecting his daily life. GAD7 and PHQ9 completed. Patient agreeable to start cognitive behavioral therapy and would also like to start medication to help with daily symptoms. Provided education regarding SSRIs. Will start patient on Prozac '10mg'$  daily. Educated patient about possible side effects. Advised patient that it can take 4-6 weeks prior to any symptom relief. Will follow-up in 6-8 weeks to determine if medication is helping and to see if he was able to schedule with a therapist.

## 2022-07-21 LAB — COMPREHENSIVE METABOLIC PANEL
ALT: 24 IU/L (ref 0–44)
AST: 22 IU/L (ref 0–40)
Albumin/Globulin Ratio: 2.8 — ABNORMAL HIGH (ref 1.2–2.2)
Albumin: 5.1 g/dL (ref 4.3–5.2)
Alkaline Phosphatase: 77 IU/L (ref 44–121)
BUN/Creatinine Ratio: 12 (ref 9–20)
BUN: 17 mg/dL (ref 6–20)
Bilirubin Total: 0.4 mg/dL (ref 0.0–1.2)
CO2: 25 mmol/L (ref 20–29)
Calcium: 10 mg/dL (ref 8.7–10.2)
Chloride: 105 mmol/L (ref 96–106)
Creatinine, Ser: 1.38 mg/dL — ABNORMAL HIGH (ref 0.76–1.27)
Globulin, Total: 1.8 g/dL (ref 1.5–4.5)
Glucose: 101 mg/dL — ABNORMAL HIGH (ref 70–99)
Potassium: 4.4 mmol/L (ref 3.5–5.2)
Sodium: 146 mmol/L — ABNORMAL HIGH (ref 134–144)
Total Protein: 6.9 g/dL (ref 6.0–8.5)
eGFR: 73 mL/min/{1.73_m2} (ref >59)

## 2022-07-21 LAB — CBC WITH DIFFERENTIAL/PLATELET
Basophils Absolute: 0 10*3/uL (ref 0.0–0.2)
Basos: 1 %
EOS (ABSOLUTE): 0.1 10*3/uL (ref 0.0–0.4)
Eos: 2 %
Hematocrit: 46.2 % (ref 37.5–51.0)
Hemoglobin: 15.7 g/dL (ref 13.0–17.7)
Immature Grans (Abs): 0 10*3/uL (ref 0.0–0.1)
Immature Granulocytes: 0 %
Lymphocytes Absolute: 2.4 10*3/uL (ref 0.7–3.1)
Lymphs: 36 %
MCH: 29.1 pg (ref 26.6–33.0)
MCHC: 34 g/dL (ref 31.5–35.7)
MCV: 86 fL (ref 79–97)
Monocytes Absolute: 0.5 10*3/uL (ref 0.1–0.9)
Monocytes: 7 %
Neutrophils Absolute: 3.6 10*3/uL (ref 1.4–7.0)
Neutrophils: 54 %
Platelets: 263 10*3/uL (ref 150–450)
RBC: 5.4 x10E6/uL (ref 4.14–5.80)
RDW: 12.4 % (ref 11.6–15.4)
WBC: 6.6 10*3/uL (ref 3.4–10.8)

## 2022-07-21 LAB — LIPID PANEL
Chol/HDL Ratio: 3.1 ratio (ref 0.0–5.0)
Cholesterol, Total: 211 mg/dL — ABNORMAL HIGH (ref 100–199)
HDL: 67 mg/dL (ref >39)
LDL Chol Calc (NIH): 126 mg/dL — ABNORMAL HIGH (ref 0–99)
Triglycerides: 102 mg/dL (ref 0–149)
VLDL Cholesterol Cal: 18 mg/dL (ref 5–40)

## 2022-07-21 LAB — HEMOGLOBIN A1C
Est. average glucose Bld gHb Est-mCnc: 97 mg/dL
Hgb A1c MFr Bld: 5 % (ref 4.8–5.6)

## 2022-07-21 LAB — TSH RFX ON ABNORMAL TO FREE T4: TSH: 1.59 u[IU]/mL (ref 0.450–4.500)

## 2022-07-23 ENCOUNTER — Other Ambulatory Visit (HOSPITAL_BASED_OUTPATIENT_CLINIC_OR_DEPARTMENT_OTHER): Payer: Self-pay

## 2022-07-23 DIAGNOSIS — R7989 Other specified abnormal findings of blood chemistry: Secondary | ICD-10-CM

## 2022-07-23 NOTE — Addendum Note (Signed)
Addended by: Les Pou on: 07/23/2022 01:37 PM   Modules accepted: Level of Service

## 2022-08-19 ENCOUNTER — Ambulatory Visit (HOSPITAL_BASED_OUTPATIENT_CLINIC_OR_DEPARTMENT_OTHER): Payer: 59

## 2022-08-19 DIAGNOSIS — R7989 Other specified abnormal findings of blood chemistry: Secondary | ICD-10-CM

## 2022-08-20 LAB — COMPREHENSIVE METABOLIC PANEL
ALT: 49 IU/L — ABNORMAL HIGH (ref 0–44)
AST: 77 IU/L — ABNORMAL HIGH (ref 0–40)
Albumin/Globulin Ratio: 2.3 — ABNORMAL HIGH (ref 1.2–2.2)
Albumin: 4.8 g/dL (ref 4.3–5.2)
Alkaline Phosphatase: 71 IU/L (ref 44–121)
BUN/Creatinine Ratio: 16 (ref 9–20)
BUN: 19 mg/dL (ref 6–20)
Bilirubin Total: 0.4 mg/dL (ref 0.0–1.2)
CO2: 23 mmol/L (ref 20–29)
Calcium: 9.9 mg/dL (ref 8.7–10.2)
Chloride: 101 mmol/L (ref 96–106)
Creatinine, Ser: 1.2 mg/dL (ref 0.76–1.27)
Globulin, Total: 2.1 g/dL (ref 1.5–4.5)
Glucose: 84 mg/dL (ref 70–99)
Potassium: 4.4 mmol/L (ref 3.5–5.2)
Sodium: 139 mmol/L (ref 134–144)
Total Protein: 6.9 g/dL (ref 6.0–8.5)
eGFR: 87 mL/min/{1.73_m2} (ref >59)

## 2022-08-24 ENCOUNTER — Ambulatory Visit (HOSPITAL_BASED_OUTPATIENT_CLINIC_OR_DEPARTMENT_OTHER): Payer: 59 | Admitting: Family Medicine

## 2022-08-24 ENCOUNTER — Encounter (HOSPITAL_BASED_OUTPATIENT_CLINIC_OR_DEPARTMENT_OTHER): Payer: Self-pay | Admitting: Family Medicine

## 2022-08-24 VITALS — BP 147/81 | HR 86 | Ht 71.0 in | Wt 166.0 lb

## 2022-08-24 DIAGNOSIS — R748 Abnormal levels of other serum enzymes: Secondary | ICD-10-CM | POA: Insufficient documentation

## 2022-08-24 DIAGNOSIS — F419 Anxiety disorder, unspecified: Secondary | ICD-10-CM | POA: Diagnosis not present

## 2022-08-24 MED ORDER — FLUOXETINE HCL 20 MG PO CAPS: 20.0000 mg | ORAL_CAPSULE | Freq: Every day | ORAL | 0 refills | Status: DC

## 2022-08-24 NOTE — Progress Notes (Signed)
Established Patient Office Visit  Subjective   Patient ID: Marc Savage, male    DOB: 2000-11-14  Age: 22 y.o. MRN: [id number]  [name] is a 22 yo male patient who presents today for mood follow-up.   Reports that his anxiety has improved. Reports no panic attack since starting the medication, but reports he is still a little anxious. Reports he sometimes feels "groggy in the morning." Denies N/V, diarrhea, constipation, headache, insomnia, sexual dysfunction, changes in appetite, sweating, or dry mouth.   Review of Systems  Respiratory:  Negative for cough and shortness of breath.   Cardiovascular:  Negative for chest pain.  Gastrointestinal:  Negative for abdominal pain, nausea and vomiting.  Musculoskeletal:  Negative for myalgias.  Neurological:  Negative for dizziness, weakness and headaches.  Psychiatric/Behavioral:  Negative for depression, substance abuse and suicidal ideas. The patient is not nervous/anxious.        08/24/2022    3:03 PM 07/20/2022    3:19 PM 07/20/2022    3:13 PM  GAD 7 : Generalized Anxiety Score  Nervous, Anxious, on Edge 1 3 3   Control/stop worrying 1 3 3   Worry too much - different things 1 3 3   Trouble relaxing 1 3 3   Restless 0 1 1  Easily annoyed or irritable 1 2 2   Afraid - awful might happen 0 3 3  Total GAD 7 Score 5 18 18   Anxiety Difficulty Somewhat difficult Somewhat difficult Somewhat difficult    Objective:    BP (!) 147/81   Pulse 86   Ht 5\' 11"  (1.803 m)   Wt 166 lb (75.3 kg)   SpO2 98%   BMI 23.15 kg/m  BP Readings from Last 3 Encounters:  08/24/22 (!) 147/81  07/20/22 139/79  02/14/19 127/71    Physical Exam Constitutional:      Appearance: Normal appearance.  Cardiovascular:     Rate and Rhythm: Normal rate and regular rhythm.     Pulses: Normal pulses.     Heart sounds: Normal heart sounds.  Pulmonary:     Effort: Pulmonary effort is normal.     Breath sounds: Normal breath sounds.  Neurological:      Mental Status: He is alert.  Psychiatric:        Mood and Affect: Mood normal.        Behavior: Behavior normal.        Thought Content: Thought content normal.        Judgment: Judgment normal.     Assessment & Plan:  1. Anxiety Patient reports improvement in his anxiety, with no recent panic attacks. GAD7 completed with a score of 5 (mild anxiety). Greatly improved from previous score of 18. Advised patient about benefits of cognitive behavioral therapy (CBT), will consider at this time. Discussed options regarding staying at same dose or increasing dose. Advised that drowsiness typically improves over time as the body adjusts to the medication. Patient would like to trial 20mg  Prozac daily. Refill ordered. Follow-up in 4-6 weeks to assess mood.   2. Elevated liver enzymes Patient had question regarding his abnormal labs. Repeat CMP showed improved serum creatinine, but did show an increase in liver enzymes. Discussed causes of elevation. Advised patient to focus on lifestyle modifications at this time and we will recheck them at next appointment. No acute concerns today on exam.   - Comprehensive metabolic panel; Future    Return in about 6 weeks (around 10/05/2022) for Mood f/u.  Alyson Reedy, FNP

## 2022-10-07 ENCOUNTER — Telehealth: Payer: 59 | Admitting: Nurse Practitioner

## 2022-10-07 DIAGNOSIS — L739 Follicular disorder, unspecified: Secondary | ICD-10-CM | POA: Diagnosis not present

## 2022-10-07 MED ORDER — CEPHALEXIN 500 MG PO CAPS
500.0000 mg | ORAL_CAPSULE | Freq: Three times a day (TID) | ORAL | 0 refills | Status: AC
Start: 2022-10-07 — End: 2022-10-14

## 2022-10-07 MED ORDER — MUPIROCIN 2 % EX OINT
1.0000 | TOPICAL_OINTMENT | Freq: Every day | CUTANEOUS | 0 refills | Status: DC
Start: 2022-10-07 — End: 2023-01-28

## 2022-10-07 NOTE — Progress Notes (Signed)
  E-Visit for Folliculitis   We are sorry you are not feeling well.  Here is how we plan to help!  Based on what you have shared with me it looks like you have folliculitis.  Folliculitis refers to inflammation of the superficial or deep portio of the hair follicle.  It can be infectious or non-infectious. Various bacteria, fungi, viruses, and parasites can cause infectious folliculis  Based upon what you have shared with me it looks like you have a bacterial follicultits.  Folliculitis is inflammation of the hair follicles that can be caused by a superficial infection of the skin and is treated with an antibiotic. I have prescribed:, Topical mupiricin, and Keflex 500 mg three times per day for 7 days     HOME CARE: Apply a warm, moist washcloth or compress using a saltwater solution (1 teaspoon of table salt to 2 cups water) Apply over the counter antibiotic cream, gel or wash  Apply soothing lotions such as oatmeal lotion or over the counter hydrocortisone cream Clean the affected skin twice daily with antibacterial soap. Use clean washcloth and towel each time and do not share with anyone.  Wash these items and clothes that have touched the area with hot soapy water. Protect the skin. If possible avoid shaving.  If you must shave, try an Neurosurgeon.  When done, rinse skin with warm water and apply moisturizer.  GET HELP RIGHT AWAY IF: You have extensive skin involvement or the symptoms return after treatment Symptoms don't go away after treatment. Severe itching that persists. If you rash spreads or swells. If you rash begins to smell. If it blisters and opens or develops a yellow-brown crust. You develop a fever. You have a sore throat. You become short of breath.  MAKE SURE YOU:  Understand these instructions. Will watch your condition. Will get help right away if you are not doing well or get worse.  Thank you for choosing an e-visit.  Your e-visit answers were  reviewed by a board certified advanced clinical practitioner to complete your personal care plan. Depending upon the condition, your plan could have included both over the counter or prescription medications.  Please review your pharmacy choice. Make sure the pharmacy is open so you can pick up prescription now. If there is a problem, you may contact your provider through [employer] and have the prescription routed to another pharmacy.  Your safety is important to [place]. If you have drug allergies check your prescription carefully.   For the next 24 hours you can use MyChart to ask questions about today's visit, request a non-urgent call back, or ask for a work or school excuse. You will get an email in the next two days asking about your experience. I hope that your e-visit has been valuable and will speed your recovery.   Meds ordered this encounter  Medications   cephALEXin (KEFLEX) 500 MG capsule    Sig: Take 1 capsule (500 mg total) by mouth 3 (three) times daily for 7 days.    Dispense:  21 capsule    Refill:  0   mupirocin ointment (BACTROBAN) 2 %    Sig: Apply 1 Application topically daily.    Dispense:  22 g    Refill:  0     I spent approximately 5 minutes reviewing the patient's history, current symptoms and coordinating their care today.

## 2022-10-08 ENCOUNTER — Ambulatory Visit (HOSPITAL_COMMUNITY): Payer: Self-pay

## 2022-10-09 ENCOUNTER — Encounter (HOSPITAL_BASED_OUTPATIENT_CLINIC_OR_DEPARTMENT_OTHER): Payer: Self-pay | Admitting: Family Medicine

## 2022-10-09 ENCOUNTER — Ambulatory Visit (HOSPITAL_BASED_OUTPATIENT_CLINIC_OR_DEPARTMENT_OTHER): Payer: 59 | Admitting: Family Medicine

## 2022-10-13 ENCOUNTER — Encounter (HOSPITAL_BASED_OUTPATIENT_CLINIC_OR_DEPARTMENT_OTHER): Payer: Self-pay | Admitting: Family Medicine

## 2022-10-13 ENCOUNTER — Telehealth (HOSPITAL_BASED_OUTPATIENT_CLINIC_OR_DEPARTMENT_OTHER): Payer: 59 | Admitting: Family Medicine

## 2022-10-13 VITALS — Ht 71.0 in | Wt 163.0 lb

## 2022-10-13 DIAGNOSIS — R7401 Elevation of levels of liver transaminase levels: Secondary | ICD-10-CM | POA: Diagnosis not present

## 2022-10-13 DIAGNOSIS — F419 Anxiety disorder, unspecified: Secondary | ICD-10-CM | POA: Diagnosis not present

## 2022-10-13 MED ORDER — FLUOXETINE HCL 20 MG PO CAPS
20.0000 mg | ORAL_CAPSULE | Freq: Every day | ORAL | 1 refills | Status: DC
Start: 2022-10-13 — End: 2023-10-18

## 2022-10-13 NOTE — Progress Notes (Signed)
Established Patient Office Visit  Subjective   Patient ID: Marc Savage, male    DOB: Jun 29, 2000  Age: 22 y.o. MRN: [id number]  Virtual Visit via Video Note  I connected with Marc Savage on 10/13/22 by a video enabled telemedicine application and verified that I am speaking with the correct person using two identifiers.  Patient Location: Home Provider Location: Office/Clinic  I discussed the limitations, risks, security, and privacy concerns of performing an evaluation and management service by video and the availability of in person appointments. I also discussed with the patient that there may be a patient responsible charge related to this service. The patient expressed understanding and agreed to proceed.  Anxiety: Patient complains of anxiety disorder.  He has the following symptoms: none. Onset of symptoms was approximately a few months ago, started Prozac 10mg  daily 07/20/2022 and increased dose to 20mg  daily 08/24/2022. He reports gradually improving since that time. He denies current suicidal and homicidal ideation. Family history significant for alcoholism, heart disease, and substance abuse.Risk factors: positive family history in  father and grandparents Previous treatment includes Prozac.  He complains of the following side effects from the treatment: none. He feels like his anxiety is well controlled on this current dose.      10/13/2022    2:33 PM 08/24/2022    3:03 PM 07/20/2022    3:19 PM 07/20/2022    3:13 PM  GAD 7 : Generalized Anxiety Score  Nervous, Anxious, on Edge 0 1 3 3   Control/stop worrying 0 1 3 3   Worry too much - different things 0 1 3 3   Trouble relaxing 0 1 3 3   Restless 0 0 1 1  Easily annoyed or irritable 0 1 2 2   Afraid - awful might happen 0 0 3 3  Total GAD 7 Score 0 5 18 18   Anxiety Difficulty Not difficult at all Somewhat difficult Somewhat difficult Somewhat difficult       10/13/2022    2:31 PM 07/20/2022    3:19 PM 07/20/2022    3:12 PM  02/14/2019    3:59 PM 07/18/2018    1:18 PM  Depression screen PHQ 2/9  Decreased Interest 0 2 2 0 2  Down, Depressed, Hopeless 0 0 0 0 1  PHQ - 2 Score 0 2 2 0 3  Altered sleeping 1 3 3  0 3  Tired, decreased energy 1 3 3  0 3  Change in appetite 0 1 1 0 2  Feeling bad or failure about yourself  0 1 1 0 1  Trouble concentrating 0 2 2 0 0  Moving slowly or fidgety/restless 0 2 2 0 0  Suicidal thoughts 0 0 0 0 0  PHQ-9 Score 2 14 14  0 12  Difficult doing work/chores Not difficult at all Somewhat difficult Somewhat difficult Not difficult at all      Review of Systems  Constitutional:  Negative for malaise/fatigue.  Eyes:  Negative for blurred vision and double vision.  Respiratory:  Negative for cough and shortness of breath.   Cardiovascular:  Negative for chest pain and palpitations.  Gastrointestinal:  Negative for abdominal pain, nausea and vomiting.  Musculoskeletal:  Negative for myalgias.  Neurological:  Negative for dizziness, weakness and headaches.  Psychiatric/Behavioral:  Negative for depression, substance abuse and suicidal ideas. The patient is not nervous/anxious and does not have insomnia.       Objective:     Ht 5\' 11"  (1.803 m)   Wt 163  lb (73.9 kg)   BMI 22.73 kg/m  BP Readings from Last 3 Encounters:  08/24/22 (!) 147/81  07/20/22 139/79  02/14/19 127/71     Physical Exam: Limited due to virtual visit  Speaking full sentences, no audible heavy breathing.  Sounds alert and appropriately interactive.  Appears well.     Assessment & Plan:  1. Anxiety Patient reports his anxiety has improved since increasing Prozac to 20mg  daily. GAD7 and PHQ9 completed today with an improvement in both scores. Patient denies adverse medication side effects and would like to continue with 20mg  dose. Advised that it would be reasonable to schedule 29-month follow-up for management of mood. Advised patient to follow-up sooner if he notices his anxiety increasing or medication  side effects.  - FLUoxetine (PROZAC) 20 MG capsule; Take 1 capsule (20 mg total) by mouth daily.  Dispense: 90 capsule; Refill: 1  2. Transaminitis Plan to repeat CMP at next visit, due to transaminitis, noted on labs performed on 08/19/2022.   I discussed the assessment and treatment plan with the patient. The patient was provided an opportunity to ask questions, and all were answered. The patient agreed with the plan and demonstrated an understanding of the instructions.   The patient was advised to call back or seek an in-person evaluation if the symptoms worsen or if the condition fails to improve as anticipated.  The above assessment and management plan was discussed with the patient. The patient verbalized understanding of and has agreed to the management plan.   Return in about 3 months (around 01/13/2023) for Mood f/u.    Alyson Reedy, FNP

## 2023-01-18 ENCOUNTER — Ambulatory Visit (HOSPITAL_BASED_OUTPATIENT_CLINIC_OR_DEPARTMENT_OTHER): Payer: 59 | Admitting: Family Medicine

## 2023-01-20 ENCOUNTER — Ambulatory Visit (HOSPITAL_BASED_OUTPATIENT_CLINIC_OR_DEPARTMENT_OTHER): Payer: 59 | Admitting: Family Medicine

## 2023-01-28 ENCOUNTER — Ambulatory Visit (HOSPITAL_BASED_OUTPATIENT_CLINIC_OR_DEPARTMENT_OTHER): Payer: 59 | Admitting: Family Medicine

## 2023-01-28 ENCOUNTER — Encounter (HOSPITAL_BASED_OUTPATIENT_CLINIC_OR_DEPARTMENT_OTHER): Payer: Self-pay | Admitting: Family Medicine

## 2023-01-28 VITALS — BP 135/70 | HR 56 | Ht 71.0 in | Wt 168.0 lb

## 2023-01-28 DIAGNOSIS — M25511 Pain in right shoulder: Secondary | ICD-10-CM

## 2023-01-28 DIAGNOSIS — F419 Anxiety disorder, unspecified: Secondary | ICD-10-CM | POA: Diagnosis not present

## 2023-01-28 DIAGNOSIS — G8929 Other chronic pain: Secondary | ICD-10-CM | POA: Diagnosis not present

## 2023-01-28 NOTE — Progress Notes (Signed)
Established Patient Office Visit  Subjective   Patient ID: Marc Savage, male    DOB: [birth year]  Age: 22 y.o. MRN: [id number]  No chief complaint on file.  ANXIETY: Marc Savage is a 22 year-old male patient who presents for the medical management of anxiety.  Current medication regimen: Prozac 20mg  daily, denies side effects. Feels "back to normal"  Well controlled: yes Denies SI/HI.   R shoulder pain: towards the front  His arm will get more tired  He was playing basketball in high school- about 7 years ago. Recently, experiences more dull pain. Reports that he often hears a popping sound.      01/28/2023    3:41 PM 10/13/2022    2:33 PM 08/24/2022    3:03 PM 07/20/2022    3:19 PM  GAD 7 : Generalized Anxiety Score  Nervous, Anxious, on Edge 0 0 1 3  Control/stop worrying 0 0 1 3  Worry too much - different things 0 0 1 3  Trouble relaxing 0 0 1 3  Restless 0 0 0 1  Easily annoyed or irritable 0 0 1 2  Afraid - awful might happen 0 0 0 3  Total GAD 7 Score 0 0 5 18  Anxiety Difficulty Not difficult at all Not difficult at all Somewhat difficult Somewhat difficult      01/28/2023    3:41 PM 10/13/2022    2:31 PM 07/20/2022    3:19 PM  PHQ9 SCORE ONLY  PHQ-9 Total Score 0 2 14    Review of Systems  Constitutional:  Negative for malaise/fatigue.  HENT:  Negative for ear pain and tinnitus.   Eyes:  Negative for blurred vision and double vision.  Respiratory:  Negative for cough and shortness of breath.   Cardiovascular:  Negative for chest pain.  Gastrointestinal:  Negative for abdominal pain, nausea and vomiting.  Musculoskeletal:  Positive for joint pain (R shoulder). Negative for myalgias.  Neurological:  Negative for dizziness, weakness and headaches.  Psychiatric/Behavioral:  Negative for depression and suicidal ideas. The patient is not nervous/anxious and does not have insomnia.       Objective:     BP 135/70   Pulse (!) 56   Ht 5\' 11"  (1.803 m)   Wt  168 lb (76.2 kg)   SpO2 100%   BMI 23.43 kg/m  BP Readings from Last 3 Encounters:  01/28/23 135/70  08/24/22 (!) 147/81  07/20/22 139/79     Physical Exam Constitutional:      Appearance: Normal appearance.  Cardiovascular:     Rate and Rhythm: Normal rate and regular rhythm.     Pulses: Normal pulses.     Heart sounds: Normal heart sounds.  Pulmonary:     Effort: Pulmonary effort is normal.     Breath sounds: Normal breath sounds.  Musculoskeletal:     Right shoulder: Tenderness and crepitus present. No swelling. Decreased range of motion. Normal strength.     Left shoulder: Normal.  Neurological:     Mental Status: He is alert.  Psychiatric:        Mood and Affect: Mood normal.        Behavior: Behavior normal.      Assessment & Plan:  Anxiety Assessment & Plan: Patient is a 22 year old male patient who presents for mood follow-up. He reports that his anxiety has greatly improved and he feels "back to baseline." GAD7 and PHQ9 completed today with both scores of 0. Patient denies  adverse medication side effects and would like to continue with 20mg  dose. Advised that it would be reasonable to schedule 74-month follow-up for management of mood. Advised patient to follow-up sooner if he notices his anxiety increasing, medication side effects, or would be interested in weaning off medication.    Chronic right shoulder pain Assessment & Plan: Patient reports right shoulder pain that has become more noticeable over the past couple of months. He reports that he originally injured it in high school playing sports (about 7 years ago). Weakness with right arm during empty can test. Full ROM of right shoulder. Will obtain R shoulder x-ray, most likely will need MRI to determine further involvement.   Orders: -     DG Shoulder Right; Future     Return in about 6 months (around 07/28/2023) for Mood f/u.    Alyson Reedy, FNP

## 2023-01-31 DIAGNOSIS — G8929 Other chronic pain: Secondary | ICD-10-CM | POA: Insufficient documentation

## 2023-01-31 NOTE — Assessment & Plan Note (Addendum)
Patient is a 22 year old male patient who presents for mood follow-up. He reports that his anxiety has greatly improved and he feels "back to baseline." GAD7 and PHQ9 completed today with both scores of 0. Patient denies adverse medication side effects and would like to continue with 20mg  dose. Advised that it would be reasonable to schedule 38-month follow-up for management of mood. Advised patient to follow-up sooner if he notices his anxiety increasing, medication side effects, or would be interested in weaning off medication.

## 2023-01-31 NOTE — Assessment & Plan Note (Signed)
Patient reports right shoulder pain that has become more noticeable over the past couple of months. He reports that he originally injured it in high school playing sports (about 7 years ago). Weakness with right arm during empty can test. Full ROM of right shoulder. Will obtain R shoulder x-ray, most likely will need MRI to determine further involvement.

## 2023-02-22 ENCOUNTER — Encounter (HOSPITAL_BASED_OUTPATIENT_CLINIC_OR_DEPARTMENT_OTHER): Payer: Self-pay | Admitting: Family Medicine

## 2023-02-25 ENCOUNTER — Telehealth: Payer: 59 | Admitting: Family Medicine

## 2023-02-25 DIAGNOSIS — B370 Candidal stomatitis: Secondary | ICD-10-CM

## 2023-02-26 MED ORDER — NYSTATIN 100000 UNIT/ML MT SUSP
5.0000 mL | Freq: Four times a day (QID) | OROMUCOSAL | 0 refills | Status: AC
Start: 2023-02-26 — End: 2023-03-03

## 2023-02-26 NOTE — Progress Notes (Signed)
E-Visit for Mouth Pain  We are sorry that you are not feeling well.  Here is how we plan to help!  Based on what you have shared with me, it appears that you do have oral thrush.     The following medications should decrease the discomfort and help with healing.   I have prescribed Nystatin suspension. Take 5mL every 6 hours while awake until resolved, normally around 5 days.    Prevention: Talk to your doctor if you are taking meds that are known to cause mouth ulcers such as:   Anti-inflammatory drugs (for example Ibuprofen, Naproxen sodium), pain killers, Beta blockers, Oral nicotine replacement drugs, Some street drugs (heroin).   Avoid allowing any tablets to dissolve in your mouth that are meant to swallowed whole Avoid foods/drinks that trigger or worsen symptoms Keep your mouth clean with daily brushing and flossing  Home Care: The goal with treatment is to ease the pain where ulcers occur and help them heal as quickly as possible.  There is no medical treatment to prevent mouth ulcers from coming back or recurring.  Avoid spicy and acidic foods Eat soft foods and avoid rough, crunchy foods Avoid chewing gum Do not use toothpaste that contains sodium lauryl sulphite Use a straw to drink which helps avoid liquids toughing the ulcers near the front of your mouth Use a very soft toothbrush If you have dentures or dental hardware that you feel is not fitting well or contributing to his, please see your dentist. Use saltwater mouthwash which helps healing. Dissolve a  teaspoon of salt in a glass of warm water. Swish around your mouth and spit it out. This can be used as needed if it is soothing.   GET HELP RIGHT AWAY IF: Persistent ulcers require checking IN PERSON (face to face). Any mouth lesion lasting longer than a month should be seen by your DENTIST as soon as possible for evaluation for possible oral cancer. If you have a non-painful ulcer in 1 or more areas of your  mouth Ulcers that are spreading, are very large or particularly painful Ulcers last longer than one week without improving on treatment If you develop a fever, swollen glands and begin to feel unwell Ulcers that developed after starting a new medication MAKE SURE YOU: Understand these instructions. Will watch your condition. Will get help right away if you are not doing well or get worse.  Thank you for choosing an e-visit.  Your e-visit answers were reviewed by a board certified advanced clinical practitioner to complete your personal care plan. Depending upon the condition, your plan could have included both over the counter or prescription medications.  Please review your pharmacy choice. Make sure the pharmacy is open so you can pick up prescription now. If there is a problem, you may contact your provider through [employer] and have the prescription routed to another pharmacy.  Your safety is important to [place]. If you have drug allergies check your prescription carefully.   For the next 24 hours you can use MyChart to ask questions about today's visit, request a non-urgent call back, or ask for a work or school excuse. You will get an email in the next two days asking about your experience. I hope that your e-visit has been valuable and will speed your recovery.  I have spent 5 minutes in review of e-visit questionnaire, review and updating patient chart, medical decision making and response to patient.   Margaretann Loveless, PA-C

## 2023-03-10 ENCOUNTER — Telehealth: Payer: 59 | Admitting: Physician Assistant

## 2023-03-10 DIAGNOSIS — K148 Other diseases of tongue: Secondary | ICD-10-CM | POA: Diagnosis not present

## 2023-03-10 MED ORDER — NYSTATIN 100000 UNIT/ML MT SUSP: 5.0000 mL | Freq: Three times a day (TID) | OROMUCOSAL | 0 refills | Status: DC

## 2023-03-10 NOTE — Progress Notes (Signed)
Message sent to patient requesting further input regarding current symptoms. Awaiting patient response.  

## 2023-03-10 NOTE — Progress Notes (Signed)
Thank you for clarifying.   So looking at current picture I cannot see any active thrush patches. However I do see some discoloring at the back of the tongue. This can be caused by various things including long times between meals/not hydrating well throughout the day as this helps to clean the tongue and also keep taste buds from hypertrophying (overgrowing). This can also be caused by nicotine pouches like the Zyn you had started. I would recommend a few things -- I will give a one-time short-term refill of the Nystatin for you, but I recommend (1) making sure to gargle with mouth wash at bedtime and getting and using a tongue scraper to use once daily. (2) I also recommend giving some thought to reducing use of those pouches and working your want to fully stopping. This is something your PCP or even our Virtual Urgent Care team can help with if you decide to proceed.

## 2023-03-10 NOTE — Progress Notes (Signed)
I have spent 5 minutes in review of e-visit questionnaire, review and updating patient chart, medical decision making and response to patient.   Mia Milan Cody Jacklynn Dehaas, PA-C    

## 2023-04-01 ENCOUNTER — Encounter (HOSPITAL_BASED_OUTPATIENT_CLINIC_OR_DEPARTMENT_OTHER): Payer: Self-pay | Admitting: Family Medicine

## 2023-07-05 ENCOUNTER — Ambulatory Visit: Payer: Self-pay

## 2023-08-02 ENCOUNTER — Ambulatory Visit (HOSPITAL_BASED_OUTPATIENT_CLINIC_OR_DEPARTMENT_OTHER): Payer: 59 | Admitting: Family Medicine

## 2023-10-04 ENCOUNTER — Telehealth: Admitting: Family Medicine

## 2023-10-04 DIAGNOSIS — R059 Cough, unspecified: Secondary | ICD-10-CM | POA: Diagnosis not present

## 2023-10-04 MED ORDER — BENZONATATE 100 MG PO CAPS
100.0000 mg | ORAL_CAPSULE | Freq: Three times a day (TID) | ORAL | 0 refills | Status: AC | PRN
Start: 2023-10-04 — End: 2023-10-11

## 2023-10-04 NOTE — Progress Notes (Signed)
 E-Visit for Cough   We are sorry that you are not feeling well.  Here is how we plan to help!  Based on your presentation I believe you most likely have A cough due to a virus.  This is called viral bronchitis and is best treated by rest, plenty of fluids and control of the cough.  You may use Ibuprofen  or Tylenol  as directed to help your symptoms.     In addition you may use A prescription cough medication called Tessalon Perles 100mg . You may take 1-2 capsules every 8 hours as needed for your cough.   From your responses in the eVisit questionnaire you describe inflammation in the upper respiratory tract which is causing a significant cough.  This is commonly called Bronchitis and has four common causes:   Allergies Viral Infections Acid Reflux Bacterial Infection Allergies, viruses and acid reflux are treated by controlling symptoms or eliminating the cause. An example might be a cough caused by taking certain blood pressure medications. You stop the cough by changing the medication. Another example might be a cough caused by acid reflux. Controlling the reflux helps control the cough.  USE OF BRONCHODILATOR ("RESCUE") INHALERS: There is a risk from using your bronchodilator too frequently.  The risk is that over-reliance on a medication which only relaxes the muscles surrounding the breathing tubes can reduce the effectiveness of medications prescribed to reduce swelling and congestion of the tubes themselves.  Although you feel brief relief from the bronchodilator inhaler, your asthma may actually be worsening with the tubes becoming more swollen and filled with mucus.  This can delay other crucial treatments, such as oral steroid medications. If you need to use a bronchodilator inhaler daily, several times per day, you should discuss this with your provider.  There are probably better treatments that could be used to keep your asthma under control.     HOME CARE Only take medications as  instructed by your medical team. Complete the entire course of an antibiotic. Drink plenty of fluids and get plenty of rest. Avoid close contacts especially the very young and the elderly Cover your mouth if you cough or cough into your sleeve. Always remember to wash your hands A steam or ultrasonic humidifier can help congestion.   GET HELP RIGHT AWAY IF: You develop worsening fever. You become short of breath You cough up blood. Your symptoms persist after you have completed your treatment plan MAKE SURE YOU  Understand these instructions. Will watch your condition. Will get help right away if you are not doing well or get worse.    Thank you for choosing an e-visit.  Your e-visit answers were reviewed by a board certified advanced clinical practitioner to complete your personal care plan. Depending upon the condition, your plan could have included both over the counter or prescription medications.  Please review your pharmacy choice. Make sure the pharmacy is open so you can pick up prescription now. If there is a problem, you may contact your provider through [employer] and have the prescription routed to another pharmacy.  Your safety is important to us . If you have drug allergies check your prescription carefully.   For the next 24 hours you can use MyChart to ask questions about today's visit, request a non-urgent call back, or ask for a work or school excuse. You will get an email in the next two days asking about your experience. I hope that your e-visit has been valuable and will speed your recovery.  I have spent 5 minutes in review of e-visit questionnaire, review and updating patient chart, medical decision making and response to patient.   Louvenia Roys, PA-C

## 2023-10-06 ENCOUNTER — Ambulatory Visit (HOSPITAL_BASED_OUTPATIENT_CLINIC_OR_DEPARTMENT_OTHER)
Admission: RE | Admit: 2023-10-06 | Discharge: 2023-10-06 | Disposition: A | Discharge status: Home / Self Care | Source: Ambulatory Visit | Attending: Family Medicine | Admitting: Family Medicine

## 2023-10-06 ENCOUNTER — Encounter (HOSPITAL_BASED_OUTPATIENT_CLINIC_OR_DEPARTMENT_OTHER): Payer: Self-pay

## 2023-10-06 ENCOUNTER — Other Ambulatory Visit (HOSPITAL_BASED_OUTPATIENT_CLINIC_OR_DEPARTMENT_OTHER): Payer: Self-pay

## 2023-10-06 VITALS — BP 121/81 | HR 78 | Temp 98.9°F | Resp 20

## 2023-10-06 DIAGNOSIS — J0110 Acute frontal sinusitis, unspecified: Secondary | ICD-10-CM | POA: Diagnosis not present

## 2023-10-06 MED ORDER — AMOXICILLIN-POT CLAVULANATE 875-125 MG PO TABS
1.0000 | ORAL_TABLET | Freq: Two times a day (BID) | ORAL | 0 refills | Status: DC
Start: 2023-10-06 — End: 2023-10-14
  Filled 2023-10-06: qty 14, 7d supply, fill #0

## 2023-10-06 NOTE — Discharge Instructions (Addendum)
 Treating you for signs of action.  Take the antibiotics as prescribed.  Can continue with over-the-counter Mucinex for congestion, cough. Can also do Zyrtec for drainage and congestion Follow-up as needed

## 2023-10-06 NOTE — ED Provider Notes (Signed)
 [facility]    CSN: [id number] Arrival date & time: 10/06/23  1334      History   Chief Complaint Chief Complaint  Patient presents with   Cough    Entered by patient    HPI Marc Savage is a 23 y.o. male.   23 year old male presents today with nasal congestion, rhinorrhea, cough.  Symptoms have been worsening over the last 5 days.  Now he is feeling very fatigued and has frontal headache and pressure behind the eyes. No fever but has had felt hot and chilled at times.      Past Medical History:  Diagnosis Date   Anxiety 07/20/2022   h/o Exercise-induced asthma 12/18/2015   Palpitation 07/20/2022    Patient Active Problem List   Diagnosis Date Noted   Chronic right shoulder pain 01/31/2023   Anxiety 07/20/2022    Past Surgical History:  Procedure Laterality Date   ADENOIDECTOMY     TONSILLECTOMY     TONSILLECTOMY Bilateral    TONSILLECTOMY AND ADENOIDECTOMY         Home Medications    Prior to Admission medications   Medication Sig Start Date End Date Taking? Authorizing Provider  amoxicillin -clavulanate (AUGMENTIN) 875-125 MG tablet Take 1 tablet by mouth every 12 (twelve) hours. 10/06/23  Yes Marc Savage A, FNP  albuterol  (PROVENTIL  HFA;VENTOLIN  HFA) 108 (90 Base) MCG/ACT inhaler 2 puffs prior to exercise as needed for exercise-induced shortness of breath/asthma 06/02/18   Savage, Marc Bridgeman, DO  benzonatate (TESSALON) 100 MG capsule Take 1 capsule (100 mg total) by mouth 3 (three) times daily as needed for up to 7 days. 10/04/23 10/11/23  Marc Roys, PA-C  FLUoxetine  (PROZAC ) 20 MG capsule Take 1 capsule (20 mg total) by mouth daily. 10/13/22   Marc Hanson, FNP  nystatin  (MYCOSTATIN ) 100000 UNIT/ML suspension Take 5 mLs (500,000 Units total) by mouth in the morning, at noon, and at bedtime. 03/10/23   Marc Hong, PA-C    Family History Family History  Problem Relation Age of Onset   Hypertension Maternal Grandmother     Social  History Social History   Tobacco Use   Smoking status: Never    Passive exposure: Never   Smokeless tobacco: Never  Substance Use Topics   Alcohol use: Yes    Alcohol/week: 2.0 - 6.0 standard drinks of alcohol    Types: 2 - 6 Cans of beer per week    Comment: socially   Drug use: No     Allergies   Sulfa antibiotics   Review of Systems Review of Systems  See HPI Physical Exam Triage Vital Signs ED Triage Vitals  Encounter Vitals Group     BP 10/06/23 1342 121/81     Systolic BP Percentile --      Diastolic BP Percentile --      Pulse Rate 10/06/23 1342 78     Resp 10/06/23 1342 20     Temp 10/06/23 1342 98.9 F (37.2 C)     Temp Source 10/06/23 1342 Oral     SpO2 10/06/23 1342 98 %     Weight --      Height --      Head Circumference --      Peak Flow --      Pain Score 10/06/23 1344 6     Pain Loc --      Pain Education --      Exclude from Growth Chart --    No data found.  Updated Vital Signs BP 121/81 (BP Location: Right Arm)   Pulse 78   Temp 98.9 F (37.2 C) (Oral)   Resp 20   SpO2 98%   Visual Acuity Right Eye Distance:   Left Eye Distance:   Bilateral Distance:    Right Eye Near:   Left Eye Near:    Bilateral Near:     Physical Exam Vitals and nursing note reviewed.  Constitutional:      General: He is not in acute distress.    Appearance: Normal appearance. He is not ill-appearing, toxic-appearing or diaphoretic.  HENT:     Right Ear: Tympanic membrane, ear canal and external ear normal.     Left Ear: Tympanic membrane, ear canal and external ear normal.     Nose: Congestion and rhinorrhea present.     Mouth/Throat:     Pharynx: Oropharynx is clear.  Eyes:     Conjunctiva/sclera: Conjunctivae normal.  Cardiovascular:     Rate and Rhythm: Normal rate and regular rhythm.     Pulses: Normal pulses.     Heart sounds: Normal heart sounds.  Pulmonary:     Effort: Pulmonary effort is normal.     Breath sounds: Normal breath sounds.   Musculoskeletal:        General: Normal range of motion.  Skin:    General: Skin is warm and dry.  Neurological:     Mental Status: He is alert.  Psychiatric:        Mood and Affect: Mood normal.      UC Treatments / Results  Labs (all labs ordered are listed, but only abnormal results are displayed) Labs Reviewed - No data to display  EKG   Radiology No results found.  Procedures Procedures (including critical care time)  Medications Ordered in UC Medications - No data to display  Initial Impression / Assessment and Plan / UC Course  I have reviewed the triage vital signs and the nursing notes.  Pertinent labs & imaging results that were available during my care of the patient were reviewed by me and considered in my medical decision making (see chart for details).     Treating for sinus infection.   Antibiotics as prescribed.  Can continue with over-the-counter Mucinex for congestion, cough. Can also do Zyrtec for drainage and congestion Follow-up as needed Final Clinical Impressions(s) / UC Diagnoses   Final diagnoses:  Acute non-recurrent frontal sinusitis     Discharge Instructions      Treating you for signs of action.  Take the antibiotics as prescribed.  Can continue with over-the-counter Mucinex for congestion, cough. Can also do Zyrtec for drainage and congestion Follow-up as needed  ED Prescriptions     Medication Sig Dispense Auth. Provider   amoxicillin -clavulanate (AUGMENTIN) 875-125 MG tablet Take 1 tablet by mouth every 12 (twelve) hours. 14 tablet Marc Pine, FNP      PDMP not reviewed this encounter.   Marc Pine, FNP 10/06/23 1407

## 2023-10-06 NOTE — ED Triage Notes (Signed)
 Productive Cough onset 4-5 days ago. Had e-visit on 5/26 and prescribed tesselon pearles with only minimal improvement. + generalized body aches.

## 2023-10-14 ENCOUNTER — Ambulatory Visit (INDEPENDENT_AMBULATORY_CARE_PROVIDER_SITE_OTHER): Admitting: Radiology

## 2023-10-14 ENCOUNTER — Ambulatory Visit
Admission: RE | Admit: 2023-10-14 | Discharge: 2023-10-14 | Disposition: A | Discharge status: Home / Self Care | Source: Ambulatory Visit | Attending: Physician Assistant | Admitting: Physician Assistant

## 2023-10-14 ENCOUNTER — Other Ambulatory Visit: Payer: Self-pay

## 2023-10-14 VITALS — BP 124/79 | HR 71 | Temp 97.9°F | Resp 18 | Ht 71.0 in | Wt 170.0 lb

## 2023-10-14 DIAGNOSIS — R0689 Other abnormalities of breathing: Secondary | ICD-10-CM | POA: Diagnosis not present

## 2023-10-14 DIAGNOSIS — J4521 Mild intermittent asthma with (acute) exacerbation: Secondary | ICD-10-CM

## 2023-10-14 DIAGNOSIS — R0981 Nasal congestion: Secondary | ICD-10-CM

## 2023-10-14 DIAGNOSIS — R058 Other specified cough: Secondary | ICD-10-CM

## 2023-10-14 DIAGNOSIS — J069 Acute upper respiratory infection, unspecified: Secondary | ICD-10-CM | POA: Diagnosis not present

## 2023-10-14 MED ORDER — IPRATROPIUM-ALBUTEROL 0.5-2.5 (3) MG/3ML IN SOLN
3.0000 mL | Freq: Once | RESPIRATORY_TRACT | Status: AC
  Administered 2023-10-14: 3 mL via RESPIRATORY_TRACT

## 2023-10-14 MED ORDER — ALBUTEROL SULFATE HFA 108 (90 BASE) MCG/ACT IN AERS: 1.0000 | INHALATION_SPRAY | Freq: Four times a day (QID) | RESPIRATORY_TRACT | 0 refills | Status: AC | PRN

## 2023-10-14 MED ORDER — PREDNISONE 20 MG PO TABS: ORAL_TABLET | ORAL | 0 refills | Status: DC

## 2023-10-14 NOTE — ED Provider Notes (Signed)
 [facility]    CSN: [id number] Arrival date & time: 10/14/23  7829      History   Chief Complaint Chief Complaint  Patient presents with   Cough    I have had a cough now for about 2 weeks. I was prescribed amoxicillin . I am now off the amoxicillin , and still have sinus pressure in my face and a cough. After coughing I have trouble breathing afterwards and feel like my throat is closed. - Entered by patient   Sinus Pressure    HPI Marc Savage is a 23 y.o. male.   HPI Pt presents today with persistent sinus pressure and pain particularly around the eyes He reports that the chest congestion has improved but the sinus pressure has not resolved much He finished course of Augmentin  yesterday - he reports that he still had cough and facial pressure while taking but it seemed to help the chest He reports he is still having productive cough, and states that the bottom of his throat feels like there is something blocking it He reports cough is worse in AM and in the evening/ bedtime  He reports he is coughing up clear mucus now instead of green   He needs a new rescue inhaler- he has not used this during illness as he is not sure where it is   Past Medical History:  Diagnosis Date   Anxiety 07/20/2022   h/o Exercise-induced asthma 12/18/2015   Palpitation 07/20/2022    Patient Active Problem List   Diagnosis Date Noted   Chronic right shoulder pain 01/31/2023   Anxiety 07/20/2022    Past Surgical History:  Procedure Laterality Date   ADENOIDECTOMY     TONSILLECTOMY     TONSILLECTOMY Bilateral    TONSILLECTOMY AND ADENOIDECTOMY         Home Medications    Prior to Admission medications   Medication Sig Start Date End Date Taking? Authorizing Provider  albuterol  (VENTOLIN  HFA) 108 (90 Base) MCG/ACT inhaler Inhale 1-2 puffs into the lungs every 6 (six) hours as needed for wheezing or shortness of breath. 10/14/23  Yes Dorlisa Savino E, PA-C  predniSONE  (DELTASONE) 20 MG tablet Take 60mg  PO daily x 2 days, then40mg  PO daily x 2 days, then 20mg  PO daily x 3 days 10/14/23  Yes Bleu Minerd E, PA-C  albuterol  (PROVENTIL  HFA;VENTOLIN  HFA) 108 (90 Base) MCG/ACT inhaler 2 puffs prior to exercise as needed for exercise-induced shortness of breath/asthma 06/02/18   Opalski, Bernardo Bridgeman, DO  FLUoxetine  (PROZAC ) 20 MG capsule Take 1 capsule (20 mg total) by mouth daily. 10/13/22   Wilhelmena Hanson, FNP  nystatin  (MYCOSTATIN ) 100000 UNIT/ML suspension Take 5 mLs (500,000 Units total) by mouth in the morning, at noon, and at bedtime. 03/10/23   Farris Hong, PA-C    Family History Family History  Problem Relation Age of Onset   Hypertension Maternal Grandmother     Social History Social History   Tobacco Use   Smoking status: Never    Passive exposure: Never   Smokeless tobacco: Never  Vaping Use   Vaping status: Never Used  Substance Use Topics   Alcohol use: Yes    Alcohol/week: 2.0 - 6.0 standard drinks of alcohol    Types: 2 - 6 Cans of beer per week    Comment: socially   Drug use: No     Allergies   Sulfa antibiotics   Review of Systems Review of Systems  Constitutional:  Negative for chills  and fever.  HENT:  Positive for congestion, postnasal drip, sinus pressure, sinus pain and sore throat. Negative for ear pain.   Respiratory:  Positive for cough and chest tightness.   Gastrointestinal:  Negative for diarrhea, nausea and vomiting.  Musculoskeletal:  Positive for myalgias.  Skin:  Negative for rash.  Neurological:  Positive for headaches. Negative for dizziness and light-headedness.     Physical Exam Triage Vital Signs ED Triage Vitals  Encounter Vitals Group     BP 10/14/23 0834 124/79     Systolic BP Percentile --      Diastolic BP Percentile --      Pulse Rate 10/14/23 0834 71     Resp 10/14/23 0834 18     Temp 10/14/23 0834 97.9 F (36.6 C)     Temp Source 10/14/23 0834 Oral     SpO2 10/14/23 0834 98 %      Weight 10/14/23 0836 170 lb (77.1 kg)     Height 10/14/23 0836 5\' 11"  (1.803 m)     Head Circumference --      Peak Flow --      Pain Score 10/14/23 0836 0     Pain Loc --      Pain Education --      Exclude from Growth Chart --    No data found.  Updated Vital Signs BP 124/79 (BP Location: Right Arm)   Pulse 71   Temp 97.9 F (36.6 C) (Oral)   Resp 18   Ht 5\' 11"  (1.803 m)   Wt 170 lb (77.1 kg)   SpO2 98%   BMI 23.71 kg/m   Visual Acuity Right Eye Distance:   Left Eye Distance:   Bilateral Distance:    Right Eye Near:   Left Eye Near:    Bilateral Near:     Physical Exam Vitals reviewed.  Constitutional:      General: He is awake.     Appearance: Normal appearance. He is well-developed and well-groomed.  HENT:     Head: Normocephalic and atraumatic.     Right Ear: Hearing, tympanic membrane and ear canal normal.     Left Ear: Hearing, tympanic membrane and ear canal normal.     Mouth/Throat:     Lips: Pink.     Mouth: Mucous membranes are moist.     Pharynx: Oropharynx is clear. Uvula midline. No pharyngeal swelling, oropharyngeal exudate, posterior oropharyngeal erythema, uvula swelling or postnasal drip.     Tonsils: No tonsillar exudate or tonsillar abscesses.  Eyes:     General: Lids are normal. Gaze aligned appropriately.     Extraocular Movements: Extraocular movements intact.  Cardiovascular:     Rate and Rhythm: Normal rate and regular rhythm.     Heart sounds: Normal heart sounds.  Pulmonary:     Effort: Pulmonary effort is normal.     Breath sounds: Decreased air movement present. Decreased breath sounds present. No wheezing, rhonchi or rales.     Comments: Wheezes, rhonchi, rales.  Patient does mildly decreased breath sounds globally somewhat decreased air movement.  Air movement almost sounds mildly turbulent and tight. Musculoskeletal:     Cervical back: Normal range of motion and neck supple.  Lymphadenopathy:     Head:     Right side of  head: No submental, submandibular or preauricular adenopathy.     Left side of head: No submental, submandibular or preauricular adenopathy.     Cervical:     Right cervical: No superficial  cervical adenopathy.    Left cervical: No superficial cervical adenopathy.     Upper Body:     Right upper body: No supraclavicular adenopathy.     Left upper body: No supraclavicular adenopathy.  Skin:    General: Skin is warm and dry.  Neurological:     General: No focal deficit present.     Mental Status: He is alert and oriented to person, place, and time.  Psychiatric:        Mood and Affect: Mood normal.        Behavior: Behavior normal. Behavior is cooperative.        Thought Content: Thought content normal.        Judgment: Judgment normal.      UC Treatments / Results  Labs (all labs ordered are listed, but only abnormal results are displayed) Labs Reviewed - No data to display  EKG   Radiology DG Chest 2 View Result Date: 10/14/2023 CLINICAL DATA:  Decreased breath sounds EXAM: CHEST - 2 VIEW COMPARISON:  October 27, 2017 FINDINGS: The heart size and mediastinal contours are within normal limits. Both lungs are clear. The visualized skeletal structures are unremarkable. IMPRESSION: No active cardiopulmonary disease. Electronically Signed   By: Fredrich Jefferson M.D.   On: 10/14/2023 09:40    Procedures Procedures (including critical care time)  Medications Ordered in UC Medications  ipratropium-albuterol  (DUONEB) 0.5-2.5 (3) MG/3ML nebulizer solution 3 mL (3 mLs Nebulization Given 10/14/23 0859)    Initial Impression / Assessment and Plan / UC Course  I have reviewed the triage vital signs and the nursing notes.  Pertinent labs & imaging results that were available during my care of the patient were reviewed by me and considered in my medical decision making (see chart for details).      Final Clinical Impressions(s) / UC Diagnoses   Final diagnoses:  Decreased breath sounds   Cough productive of clear sputum  Nasal sinus congestion  Upper respiratory tract infection, unspecified type   Patient presents today with persistent productive cough, nasal congestion that has been ongoing despite finishing a course of Augmentin  prescribed for suspected sinus infection.  Patient was previously seen at [facility] for similar concerns and was treated with Augmentin . He reports some improvement in chest congestion but states nasal congestion is still lingering. He has also been taking Mucinex for symptoms as well. Pt has previous hx of asthma and has not had access to rescue inhaler sometime. Physical exam is notable for decreased air movement and reduced lung sounds globally. This did not improve with administration of Duoneb treatment in clinic. CXR was negative for acute cardiopulmonary illness. Reviewed results with pt during apt. At this time I suspect he may have RSV given his symptoms and minimal improvement with Abx therapy. Reviewed suspicions with pt and recommend managing asthma symptoms with prednisone taper and refill of albuterol  inhaler. Recommend OTC medications for further symptomatic management. ED and return precautions reviewed and provided in AVS. Follow up as needed for persistent or progressing symptoms       Discharge Instructions      You were seen today for concerns of persistent nasal congestion and productive cough.  Your chest x-ray was negative for signs of cardiopulmonary disease. At this time I suspect that you may have a viral infection called respiratory single virus or RSV.  This can cause very similar symptoms to a sinus infection or bad cold and typically does not respond well to antibiotics as it  is caused by a virus. I do suspect that this has aggravated your asthma so I am sending in a steroid taper to help with your breathing and refilling your albuterol  inhaler. I have sent in a script for Prednisone taper to be taken in the morning with  breakfast per the instructions on the container Remember that steroids can cause sleeplessness, irritability, increased hunger and elevated glucose levels so be mindful of these side effects. They should lessen as you progress to the lower doses of the taper. You can continue to take over-the-counter medications such as Flonase nasal spray, second-generation antihistamine such as Claritin or Zyrtec, Mucinex and Robitussin as needed. If at any point you start to develop more severe symptoms such as difficulty breathing, fever or chills that are not responding to medications, severe head pain, severe sinus pressure or swelling of the face please go to the emergency room as these could be signs of a medical emergency.    ED Prescriptions     Medication Sig Dispense Auth. Provider   albuterol  (VENTOLIN  HFA) 108 (90 Base) MCG/ACT inhaler Inhale 1-2 puffs into the lungs every 6 (six) hours as needed for wheezing or shortness of breath. 8 g Reesa Gotschall E, PA-C   predniSONE (DELTASONE) 20 MG tablet Take 60mg  PO daily x 2 days, then40mg  PO daily x 2 days, then 20mg  PO daily x 3 days 13 tablet Ginni Eichler E, PA-C      PDMP not reviewed this encounter.   Omarii Scalzo, Pearla Bottom, PA-C 10/14/23 1013

## 2023-10-14 NOTE — Discharge Instructions (Addendum)
 You were seen today for concerns of persistent nasal congestion and productive cough.  Your chest x-ray was negative for signs of cardiopulmonary disease. At this time I suspect that you may have a viral infection called respiratory single virus or RSV.  This can cause very similar symptoms to a sinus infection or bad cold and typically does not respond well to antibiotics as it is caused by a virus. I do suspect that this has aggravated your asthma so I am sending in a steroid taper to help with your breathing and refilling your albuterol  inhaler. I have sent in a script for Prednisone taper to be taken in the morning with breakfast per the instructions on the container Remember that steroids can cause sleeplessness, irritability, increased hunger and elevated glucose levels so be mindful of these side effects. They should lessen as you progress to the lower doses of the taper. You can continue to take over-the-counter medications such as Flonase nasal spray, second-generation antihistamine such as Claritin or Zyrtec, Mucinex and Robitussin as needed. If at any point you start to develop more severe symptoms such as difficulty breathing, fever or chills that are not responding to medications, severe head pain, severe sinus pressure or swelling of the face please go to the emergency room as these could be signs of a medical emergency.

## 2023-10-14 NOTE — ED Triage Notes (Addendum)
 Pt presents with complaints of productive cough, sore throat, and sinus pressure. Symptoms began approximately three weeks ago. Pt was seen at [facility] location and was prescribed amoxicillin . Finished this prescription. Little relief reported with this medication. Pt currently denies pain. States after coughing, he feels very short of breath. Hx of asthma. Needing refill on albuterol  inhaler.

## 2023-10-18 ENCOUNTER — Ambulatory Visit (INDEPENDENT_AMBULATORY_CARE_PROVIDER_SITE_OTHER): Payer: Self-pay | Admitting: Family Medicine

## 2023-10-18 ENCOUNTER — Encounter: Payer: Self-pay | Admitting: Family Medicine

## 2023-10-18 VITALS — BP 129/80 | HR 67 | Ht 71.0 in | Wt 171.0 lb

## 2023-10-18 DIAGNOSIS — J45909 Unspecified asthma, uncomplicated: Secondary | ICD-10-CM | POA: Insufficient documentation

## 2023-10-18 DIAGNOSIS — G8929 Other chronic pain: Secondary | ICD-10-CM | POA: Diagnosis not present

## 2023-10-18 DIAGNOSIS — M25511 Pain in right shoulder: Secondary | ICD-10-CM | POA: Diagnosis not present

## 2023-10-18 DIAGNOSIS — Z7689 Persons encountering health services in other specified circumstances: Secondary | ICD-10-CM

## 2023-10-18 DIAGNOSIS — J4520 Mild intermittent asthma, uncomplicated: Secondary | ICD-10-CM

## 2023-10-18 DIAGNOSIS — R7989 Other specified abnormal findings of blood chemistry: Secondary | ICD-10-CM | POA: Diagnosis not present

## 2023-10-18 NOTE — Assessment & Plan Note (Signed)
 Recently treated for asthma exacerbation secondary to viral infection.  Still taking prednisone .  Has needed to use albuterol  the last three morning but otherwise seldom uses it.

## 2023-10-18 NOTE — Patient Instructions (Addendum)
 It was nice to see you today,  We addressed the following topics today: -I am ordering an x-ray.  You can get this done at any time at [facility] imaging on [address]. - I have provided you with some exercises to try at home.  You can use a exercise band for these. -After I get your test results I will let you know if any further workup is needed.  Have a great day,  Etha Henle, MD

## 2023-10-18 NOTE — Progress Notes (Signed)
 New Patient Office Visit  Subjective   Patient ID: Marc Savage, male    DOB: [birth year]  Age: 23 y.o. MRN: [id number]  CC:  Chief Complaint  Patient presents with   New Patient (Initial Visit)    HPI Marc Savage presents to establish care  Subjective - Asthma exacerbation with morning cough and shortness of breath for past 3 days - Right shoulder pain 2-3 times weekly since basketball injury in 2017 - Occasional sharp pain in right shoulder area occurring randomly every 1-2 weeks - Sleeping on back has improved shoulder symptoms - Uses foam roller for shoulder discomfort  Medications: Albuterol  inhaler prn for asthma, prednisone  prescribed but not taken  PMH: Asthma PSH: None FH: Paternal grandfather with diabetes Social Hx: Married, expecting first child (girl) in October 2025, works in Scientist, water quality, no tobacco use, alcohol once weekly, no recreational drugs  ROS: Positive for morning cough with dyspnea, right shoulder pain. Negative for fever, chest pain.   Outpatient Encounter Medications as of 10/18/2023  Medication Sig   albuterol  (PROVENTIL  HFA;VENTOLIN  HFA) 108 (90 Base) MCG/ACT inhaler 2 puffs prior to exercise as needed for exercise-induced shortness of breath/asthma   albuterol  (VENTOLIN  HFA) 108 (90 Base) MCG/ACT inhaler Inhale 1-2 puffs into the lungs every 6 (six) hours as needed for wheezing or shortness of breath.   predniSONE  (DELTASONE ) 20 MG tablet Take 60mg  PO daily x 2 days, then40mg  PO daily x 2 days, then 20mg  PO daily x 3 days   [DISCONTINUED] FLUoxetine  (PROZAC ) 20 MG capsule Take 1 capsule (20 mg total) by mouth daily.   [DISCONTINUED] nystatin  (MYCOSTATIN ) 100000 UNIT/ML suspension Take 5 mLs (500,000 Units total) by mouth in the morning, at noon, and at bedtime.   No facility-administered encounter medications on file as of 10/18/2023.    Past Medical History:  Diagnosis Date   Anxiety 07/20/2022   h/o Exercise-induced asthma  12/18/2015   Palpitation 07/20/2022    Past Surgical History:  Procedure Laterality Date   ADENOIDECTOMY     TONSILLECTOMY     TONSILLECTOMY Bilateral    TONSILLECTOMY AND ADENOIDECTOMY      Family History  Problem Relation Age of Onset   Hypertension Maternal Grandmother     Social History   Socioeconomic History   Marital status: Significant Other    Spouse name: Not on file   Number of children: Not on file   Years of education: Not on file   Highest education level: Bachelor's degree (e.g., BA, AB, BS)  Occupational History   Occupation: Emergency planning/management officer  Tobacco Use   Smoking status: Never    Passive exposure: Never   Smokeless tobacco: Never  Vaping Use   Vaping status: Never Used  Substance and Sexual Activity   Alcohol use: Yes    Alcohol/week: 2.0 - 6.0 standard drinks of alcohol    Types: 2 - 6 Cans of beer per week    Comment: socially   Drug use: No   Sexual activity: Yes  Other Topics Concern   Not on file  Social History Narrative   Not on file   Social Drivers of Health   Financial Resource Strain: Low Risk  (08/23/2022)   Overall Financial Resource Strain (CARDIA)    Difficulty of Paying Living Expenses: Not hard at all  Food Insecurity: No Food Insecurity (08/23/2022)   Hunger Vital Sign    Worried About Running Out of Food in the Last Year: Never true  Ran Out of Food in the Last Year: Never true  Transportation Needs: No Transportation Needs (08/23/2022)   PRAPARE - Administrator, Civil Service (Medical): No    Lack of Transportation (Non-Medical): No  Physical Activity: Sufficiently Active (08/23/2022)   Exercise Vital Sign    Days of Exercise per Week: 5 days    Minutes of Exercise per Session: 90 min  Stress: Stress Concern Present (08/23/2022)   [employer] of Occupational Health - Occupational Stress Questionnaire    Feeling of Stress : To some extent  Social Connections: Moderately Integrated (08/23/2022)   Social  Connection and Isolation Panel [NHANES]    Frequency of Communication with Friends and Family: More than three times a week    Frequency of Social Gatherings with Friends and Family: Once a week    Attends Religious Services: More than 4 times per year    Active Member of [employer] or Organizations: No    Attends Engineer, structural: Not on file    Marital Status: Living with partner  Intimate Partner Violence: Unknown (08/11/2021)   Received from [employer]   HITS    Physically Hurt: Not on file    Insult or Talk Down To: Not on file    Threaten Physical Harm: Not on file    Scream or Curse: Not on file    ROS     Objective   BP 129/80   Pulse 67   Ht 5\' 11"  (1.803 m)   Wt 171 lb (77.6 kg)   SpO2 97%   BMI 23.85 kg/m   Physical Exam Gen: alert, oriented Heent: perrla, eomi Cv: rrr Pulm: lctab Gi: soft, nontender Msk: mild ttp over the right scapular border     Assessment & Plan:   Encounter to establish care  Chronic right shoulder pain Assessment & Plan: Seems consistent with infraspinatus injury/pain.  Sending in new order for shoulder xray.  Showed pt some rehab exercises he can do at home.  Can refer to sports medicine if unresponsive to home therapy.    Elevated LFTs -     Comprehensive metabolic panel with GFR; Future -     Lipid panel; Future  Mild intermittent asthma without complication Assessment & Plan: Recently treated for asthma exacerbation secondary to viral infection.  Still taking prednisone .  Has needed to use albuterol  the last three morning but otherwise seldom uses it.       Return for physical.   Laneta Pintos, MD

## 2023-10-18 NOTE — Assessment & Plan Note (Signed)
 Seems consistent with infraspinatus injury/pain.  Sending in new order for shoulder xray.  Showed pt some rehab exercises he can do at home.  Can refer to sports medicine if unresponsive to home therapy.

## 2023-10-27 ENCOUNTER — Ambulatory Visit

## 2023-10-30 ENCOUNTER — Encounter

## 2023-11-01 ENCOUNTER — Other Ambulatory Visit

## 2023-11-01 DIAGNOSIS — R7989 Other specified abnormal findings of blood chemistry: Secondary | ICD-10-CM

## 2023-11-24 ENCOUNTER — Other Ambulatory Visit

## 2023-11-25 ENCOUNTER — Ambulatory Visit: Payer: Self-pay | Admitting: Family Medicine

## 2023-11-25 ENCOUNTER — Other Ambulatory Visit: Payer: Self-pay | Admitting: Family Medicine

## 2023-11-25 DIAGNOSIS — R7401 Elevation of levels of liver transaminase levels: Secondary | ICD-10-CM

## 2023-11-25 LAB — LIPID PANEL
Chol/HDL Ratio: 4 ratio (ref 0.0–5.0)
Cholesterol, Total: 194 mg/dL (ref 100–199)
HDL: 49 mg/dL (ref >39)
LDL Chol Calc (NIH): 132 mg/dL — ABNORMAL HIGH (ref 0–99)
Triglycerides: 71 mg/dL (ref 0–149)
VLDL Cholesterol Cal: 13 mg/dL (ref 5–40)

## 2023-11-25 LAB — COMPREHENSIVE METABOLIC PANEL WITH GFR
ALT: 49 IU/L — ABNORMAL HIGH (ref 0–44)
AST: 30 IU/L (ref 0–40)
Albumin: 4.4 g/dL (ref 4.3–5.2)
Alkaline Phosphatase: 78 IU/L (ref 44–121)
BUN/Creatinine Ratio: 15 (ref 9–20)
BUN: 17 mg/dL (ref 6–20)
Bilirubin Total: 0.4 mg/dL (ref 0.0–1.2)
CO2: 22 mmol/L (ref 20–29)
Calcium: 9.6 mg/dL (ref 8.7–10.2)
Chloride: 103 mmol/L (ref 96–106)
Creatinine, Ser: 1.13 mg/dL (ref 0.76–1.27)
Globulin, Total: 2 g/dL (ref 1.5–4.5)
Glucose: 108 mg/dL — ABNORMAL HIGH (ref 70–99)
Potassium: 4.4 mmol/L (ref 3.5–5.2)
Sodium: 140 mmol/L (ref 134–144)
Total Protein: 6.4 g/dL (ref 6.0–8.5)
eGFR: 93 mL/min/1.73 (ref >59)

## 2023-11-25 NOTE — Telephone Encounter (Signed)
 LVM for pt and sent Mychart message to reply to schedule.

## 2023-11-29 ENCOUNTER — Other Ambulatory Visit

## 2023-11-29 DIAGNOSIS — R7401 Elevation of levels of liver transaminase levels: Secondary | ICD-10-CM

## 2023-11-30 LAB — PROTIME-INR
INR: 1 (ref 0.9–1.2)
Prothrombin Time: 10.7 s (ref 9.1–12.0)

## 2023-12-01 ENCOUNTER — Ambulatory Visit: Admitting: Family Medicine

## 2023-12-10 ENCOUNTER — Ambulatory Visit (HOSPITAL_BASED_OUTPATIENT_CLINIC_OR_DEPARTMENT_OTHER): Admitting: Radiology

## 2023-12-10 ENCOUNTER — Encounter (HOSPITAL_BASED_OUTPATIENT_CLINIC_OR_DEPARTMENT_OTHER): Payer: Self-pay

## 2023-12-10 ENCOUNTER — Telehealth: Admitting: Physician Assistant

## 2023-12-10 ENCOUNTER — Ambulatory Visit (HOSPITAL_BASED_OUTPATIENT_CLINIC_OR_DEPARTMENT_OTHER)
Admission: RE | Admit: 2023-12-10 | Discharge: 2023-12-10 | Disposition: A | Discharge status: Home / Self Care | Source: Ambulatory Visit | Attending: Family Medicine | Admitting: Family Medicine

## 2023-12-10 VITALS — BP 124/83 | HR 65 | Temp 98.5°F | Resp 20

## 2023-12-10 DIAGNOSIS — R059 Cough, unspecified: Secondary | ICD-10-CM

## 2023-12-10 DIAGNOSIS — R0781 Pleurodynia: Secondary | ICD-10-CM

## 2023-12-10 DIAGNOSIS — R079 Chest pain, unspecified: Secondary | ICD-10-CM

## 2023-12-10 MED ORDER — PREDNISONE 10 MG (21) PO TBPK: ORAL_TABLET | ORAL | 0 refills | Status: AC

## 2023-12-10 NOTE — ED Triage Notes (Signed)
 Cough onset 2 months ago. Left rib cage pain x 2 months as well. Pain with deep breath. Has had CXR and EKG done for symptoms when he initially had them. Has had antibiotics as well with no improvement. Patient using mucinex daily. Small amount of post nasal drip reported. Cough is productive at times. Hx of asthma.

## 2023-12-10 NOTE — Patient Instructions (Signed)
  Marc Savage, thank you for joining Teena Shuck, PA-C for today's virtual visit.  While this provider is not your primary care provider (PCP), if your PCP is located in our provider database this encounter information will be shared with them immediately following your visit.   A [facility] MyChart account gives you access to today's visit and all your visits, tests, and labs performed at [facility]  click here if you don't have a [facility] MyChart account or go to [email or web address]  Consent: (Patient) Marc Savage provided verbal consent for this virtual visit at the beginning of the encounter.  Current Medications:  Current Outpatient Medications:    albuterol  (PROVENTIL  HFA;VENTOLIN  HFA) 108 (90 Base) MCG/ACT inhaler, 2 puffs prior to exercise as needed for exercise-induced shortness of breath/asthma, Disp: 1 Inhaler, Rfl: 2   albuterol  (VENTOLIN  HFA) 108 (90 Base) MCG/ACT inhaler, Inhale 1-2 puffs into the lungs every 6 (six) hours as needed for wheezing or shortness of breath., Disp: 8 g, Rfl: 0   Medications ordered in this encounter:  No orders of the defined types were placed in this encounter.    *If you need refills on other medications prior to your next appointment, please contact your pharmacy*  Follow-Up: Call back or seek an in-person evaluation if the symptoms worsen or if the condition fails to improve as anticipated.  [facility] [phone]  Other Instructions Please report to the nearest Emergency room with any worsening symptoms. Follow up with primary care provider (PCP) in 2 -3 days.    If you have been instructed to have an in-person evaluation today at a local Urgent Care facility, please use the link below. It will take you to a list of all of our available [facility], including address, phone number and hours of operation. Please do not delay care.  [facility]  If you or a family  member do not have a primary care provider, use the link below to schedule a visit and establish care. When you choose a [facility] primary care physician or advanced practice provider, you gain a long-term partner in health. Find a Primary Care Provider  Learn more about [facility]'s in-office and virtual care options: [facility] - Get Care Now

## 2023-12-10 NOTE — Discharge Instructions (Signed)
 Your xray was normal. I am not sure if this lingering inflammation from virus or from coughing. Or stained muscle.  Start allergy medication to include zyrtec daily. I am prescribing prednisone  to help with pain and inflammation.  Need to follow up with PCP for further management of this in case you need referral.

## 2023-12-10 NOTE — Progress Notes (Signed)
 Virtual Visit Consent   [name], you are scheduled for a virtual visit with a [facility] provider today. Just as with appointments in the office, your consent must be obtained to participate. Your consent will be active for this visit and any virtual visit you may have with one of our providers in the next 365 days. If you have a MyChart account, a copy of this consent can be sent to you electronically.  As this is a virtual visit, video technology does not allow for your provider to perform a traditional examination. This may limit your provider's ability to fully assess your condition. If your provider identifies any concerns that need to be evaluated in person or the need to arrange testing (such as labs, EKG, etc.), we will make arrangements to do so. Although advances in technology are sophisticated, we cannot ensure that it will always work on either your end or our end. If the connection with a video visit is poor, the visit may have to be switched to a telephone visit. With either a video or telephone visit, we are not always able to ensure that we have a secure connection.  By engaging in this virtual visit, you consent to the provision of healthcare and authorize for your insurance to be billed (if applicable) for the services provided during this visit. Depending on your insurance coverage, you may receive a charge related to this service.  I need to obtain your verbal consent now. Are you willing to proceed with your visit today? [name] has provided verbal consent on 12/10/2023 for a virtual visit (video or telephone). Teena Shuck, [place]  Date: 12/10/2023 10:12 AM   Virtual Visit via Video Note   I, Teena Shuck, connected with  Marc Savage  ([id number], [birth year]) on 12/10/23 at 10:15 AM EDT by a video-enabled telemedicine application and verified that I am speaking with the correct person using two identifiers.  Location: Patient: Virtual Visit Location Patient:  Home Provider: Virtual Visit Location Provider: Home Office   I discussed the limitations of evaluation and management by telemedicine and the availability of in person appointments. The patient expressed understanding and agreed to proceed.    History of Present Illness: Marc Savage is a 23 y.o. who identifies as a male who was assigned male at birth, and is being seen today for cough with left sided chest pain.  HPI: Cough This is a new problem. The current episode started 1 to 4 weeks ago. The problem has been unchanged. The cough is Non-productive. Associated symptoms include chest pain. Nothing aggravates the symptoms. He has tried nothing for the symptoms. The treatment provided no relief.    Problems:  Patient Active Problem List   Diagnosis Date Noted   Asthma 10/18/2023   Chronic right shoulder pain 01/31/2023   Anxiety 07/20/2022    Allergies:  Allergies  Allergen Reactions   Sulfa Antibiotics Rash and Other (See Comments)    abd cramping   Medications:  Current Outpatient Medications:    albuterol  (PROVENTIL  HFA;VENTOLIN  HFA) 108 (90 Base) MCG/ACT inhaler, 2 puffs prior to exercise as needed for exercise-induced shortness of breath/asthma, Disp: 1 Inhaler, Rfl: 2   albuterol  (VENTOLIN  HFA) 108 (90 Base) MCG/ACT inhaler, Inhale 1-2 puffs into the lungs every 6 (six) hours as needed for wheezing or shortness of breath., Disp: 8 g, Rfl: 0  Observations/Objective: Patient is well-developed, well-nourished in no acute distress.  Resting comfortably  at home.  Head is normocephalic,  atraumatic.  No labored breathing.  Speech is clear and coherent with logical content.  Patient is alert and oriented at baseline.    Assessment and Plan: 1. Cough, unspecified type (Primary)  2. Chest pain, unspecified type  Patient with cough x 1 02 weeks and ongoing chest pain. No shortness of breath, fever, hemoptysis. States he is intermittently producing phlegm. I advised the  patient to be seen in person for a physical exam to ensure no additional underlying issues given the extent and duration of his symptoms. He verbalized understanding.   Follow Up Instructions: I discussed the assessment and treatment plan with the patient. The patient was provided an opportunity to ask questions and all were answered. The patient agreed with the plan and demonstrated an understanding of the instructions.  A copy of instructions were sent to the patient via MyChart unless otherwise noted below.    The patient was advised to call back or seek an in-person evaluation if the symptoms worsen or if the condition fails to improve as anticipated.    Teena Shuck, PA-C

## 2023-12-10 NOTE — Progress Notes (Signed)
   Thank you for the details you included in the comment boxes. Those details are very helpful in determining the best course of treatment for you and help us  to provide the best care.Because of the duration and type of symptoms, we recommend that you schedule a Virtual Urgent Care video visit in order for the provider to better assess what is going on.  The provider will be able to give you a more accurate diagnosis and treatment plan if we can more freely discuss your symptoms and with the addition of a virtual examination.   If you change your visit to a video visit, we will bill your insurance (similar to an office visit) and you will not be charged for this e-Visit. You will be able to stay at home and speak with the first available [facility] advanced practice provider. The link to do a video visit is in the drop down Menu tab of your Welcome screen in MyChart.

## 2023-12-11 NOTE — ED Provider Notes (Signed)
[facility]    CSN: [id number] Arrival date & time: 12/10/23  1033      History   Chief Complaint Chief Complaint  Patient presents with   Cough    Entered by patient   Chest Pain    HPI Marc Savage is a 23 y.o. male.   Patient is a 23 year old male who presents today with left-sided chest/rib pain.  Cough onset 2 months ago. Left rib cage pain x 2 months as well. Pain with deep breath. Has had CXR and EKG done for symptoms when he initially had them with normal results.  Results reviewed.  Has had antibiotics as well with no improvement. Patient using mucinex daily. Small amount of post nasal drip reported. Cough is productive at times. Hx of asthma   Cough Associated symptoms: chest pain   Chest Pain Associated symptoms: cough     Past Medical History:  Diagnosis Date   Anxiety 07/20/2022   h/o Exercise-induced asthma 12/18/2015   Palpitation 07/20/2022    Patient Active Problem List   Diagnosis Date Noted   Asthma 10/18/2023   Chronic right shoulder pain 01/31/2023   Anxiety 07/20/2022    Past Surgical History:  Procedure Laterality Date   ADENOIDECTOMY     TONSILLECTOMY     TONSILLECTOMY Bilateral    TONSILLECTOMY AND ADENOIDECTOMY         Home Medications    Prior to Admission medications   Medication Sig Start Date End Date Taking? Authorizing Provider  predniSONE  (STERAPRED UNI-PAK 21 TAB) 10 MG (21) TBPK tablet Take as dosed on pack 12/10/23  Yes Verley Pariseau A, FNP  albuterol  (PROVENTIL  HFA;VENTOLIN  HFA) 108 (90 Base) MCG/ACT inhaler 2 puffs prior to exercise as needed for exercise-induced shortness of breath/asthma 06/02/18   Opalski, Barnie, DO  albuterol  (VENTOLIN  HFA) 108 (90 Base) MCG/ACT inhaler Inhale 1-2 puffs into the lungs every 6 (six) hours as needed for wheezing or shortness of breath. 10/14/23   Mecum, Rocky BRAVO, PA-C    Family History Family History  Problem Relation Age of Onset   Hypertension Maternal Grandmother      Social History Social History   Tobacco Use   Smoking status: Never    Passive exposure: Never   Smokeless tobacco: Never  Vaping Use   Vaping status: Never Used  Substance Use Topics   Alcohol use: Yes    Alcohol/week: 2.0 - 6.0 standard drinks of alcohol    Types: 2 - 6 Cans of beer per week    Comment: socially   Drug use: No     Allergies   Sulfa antibiotics   Review of Systems Review of Systems  Respiratory:  Positive for cough.   Cardiovascular:  Positive for chest pain.     Physical Exam Triage Vital Signs ED Triage Vitals  Encounter Vitals Group     BP 12/10/23 1041 124/83     Girls Systolic BP Percentile --      Girls Diastolic BP Percentile --      Boys Systolic BP Percentile --      Boys Diastolic BP Percentile --      Pulse Rate 12/10/23 1041 65     Resp 12/10/23 1041 20     Temp 12/10/23 1041 98.5 F (36.9 C)     Temp Source 12/10/23 1041 Oral     SpO2 12/10/23 1041 98 %     Weight --      Height --  Head Circumference --      Peak Flow --      Pain Score 12/10/23 1043 7     Pain Loc --      Pain Education --      Exclude from Growth Chart --    No data found.  Updated Vital Signs BP 124/83 (BP Location: Right Arm)   Pulse 65   Temp 98.5 F (36.9 C) (Oral)   Resp 20   SpO2 98%   Visual Acuity Right Eye Distance:   Left Eye Distance:   Bilateral Distance:    Right Eye Near:   Left Eye Near:    Bilateral Near:     Physical Exam Constitutional:      Appearance: Normal appearance. He is not ill-appearing.  Cardiovascular:     Rate and Rhythm: Normal rate and regular rhythm.  Pulmonary:     Effort: Pulmonary effort is normal.     Breath sounds: Normal breath sounds.  Musculoskeletal:        General: Normal range of motion.  Neurological:     Mental Status: He is alert.  Psychiatric:        Mood and Affect: Mood normal.      UC Treatments / Results  Labs (all labs ordered are listed, but only abnormal results  are displayed) Labs Reviewed - No data to display  EKG   Radiology DG Ribs Unilateral W/Chest Left Result Date: 12/10/2023 CLINICAL DATA:  Rib pain.  Cough. EXAM: LEFT RIBS AND CHEST - 3+ VIEW COMPARISON:  None Available. FINDINGS: Bilateral lung fields are clear. Bilateral costophrenic angles are clear. Normal cardio-mediastinal silhouette. No acute osseous abnormalities. No acute rib fracture.  No focal rib lesion. The soft tissues are within normal limits. IMPRESSION: No acute cardiopulmonary abnormality. No acute rib fracture or focal rib lesion. Electronically Signed   By: Ree Molt M.D.   On: 12/10/2023 11:16    Procedures Procedures (including critical care time)  Medications Ordered in UC Medications - No data to display  Initial Impression / Assessment and Plan / UC Course  I have reviewed the triage vital signs and the nursing notes.  Pertinent labs & imaging results that were available during my care of the patient were reviewed by me and considered in my medical decision making (see chart for details).     Rib pain-no specific concerns on exam and x-ray without any acute findings.  Believe this is just lingering inflammation from coughing and viral illness.  I am prescribing prednisone  to help with pain and inflammation.  Recommend start allergy medication to include Zyrtec daily. Recommend follow-up with PCP for further management of this due to length of problem and possible referral Final Clinical Impressions(s) / UC Diagnoses   Final diagnoses:  Rib pain     Discharge Instructions      Your xray was normal. I am not sure if this lingering inflammation from virus or from coughing. Or stained muscle.  Start allergy medication to include zyrtec daily. I am prescribing prednisone  to help with pain and inflammation.  Need to follow up with PCP for further management of this in case you need referral.     ED Prescriptions     Medication Sig Dispense Auth.  Provider   predniSONE  (STERAPRED UNI-PAK 21 TAB) 10 MG (21) TBPK tablet Take as dosed on pack 1 each Hiliana Eilts A, FNP      PDMP not reviewed this encounter.   Adah Wilbert LABOR, FNP  12/11/23 0822  

## 2023-12-13 ENCOUNTER — Ambulatory Visit: Admitting: Family Medicine

## 2024-01-25 ENCOUNTER — Ambulatory Visit: Admitting: Family Medicine

## 2024-01-25 ENCOUNTER — Encounter: Payer: Self-pay | Admitting: Family Medicine

## 2024-05-14 ENCOUNTER — Encounter

## 2024-10-18 ENCOUNTER — Encounter: Admitting: Family Medicine
